# Patient Record
Sex: Female | Born: 1935 | Race: White | Hispanic: No | Smoking: Former smoker
Health system: Southern US, Community
[De-identification: ages and names within clinical notes are randomized; demographics above are authoritative.]

## PROBLEM LIST (undated history)

## (undated) DIAGNOSIS — I509 Heart failure, unspecified: Secondary | ICD-10-CM

## (undated) DIAGNOSIS — E119 Type 2 diabetes mellitus without complications: Secondary | ICD-10-CM

## (undated) DIAGNOSIS — R600 Localized edema: Secondary | ICD-10-CM

## (undated) DIAGNOSIS — I447 Left bundle-branch block, unspecified: Secondary | ICD-10-CM

## (undated) DIAGNOSIS — E559 Vitamin D deficiency, unspecified: Secondary | ICD-10-CM

## (undated) DIAGNOSIS — I2510 Atherosclerotic heart disease of native coronary artery without angina pectoris: Secondary | ICD-10-CM

## (undated) DIAGNOSIS — K219 Gastro-esophageal reflux disease without esophagitis: Secondary | ICD-10-CM

## (undated) DIAGNOSIS — I4891 Unspecified atrial fibrillation: Secondary | ICD-10-CM

## (undated) DIAGNOSIS — I4430 Unspecified atrioventricular block: Secondary | ICD-10-CM

## (undated) DIAGNOSIS — J449 Chronic obstructive pulmonary disease, unspecified: Secondary | ICD-10-CM

## (undated) DIAGNOSIS — G459 Transient cerebral ischemic attack, unspecified: Secondary | ICD-10-CM

## (undated) DIAGNOSIS — I10 Essential (primary) hypertension: Secondary | ICD-10-CM

## (undated) HISTORY — PX: OTHER SURGICAL HISTORY: SHX169

## (undated) HISTORY — PX: REPLACEMENT TOTAL KNEE BILATERAL: SUR1225

---

## 2020-09-29 ENCOUNTER — Encounter (HOSPITAL_COMMUNITY): Payer: Self-pay

## 2020-09-29 ENCOUNTER — Emergency Department (HOSPITAL_COMMUNITY): Payer: Medicare Other

## 2020-09-29 ENCOUNTER — Inpatient Hospital Stay (HOSPITAL_COMMUNITY)
Admission: EM | Admit: 2020-09-29 | Discharge: 2020-10-04 | DRG: 871 | Disposition: A | Discharge status: Home Health | Payer: Medicare Other | Attending: Internal Medicine | Admitting: Internal Medicine

## 2020-09-29 DIAGNOSIS — I248 Other forms of acute ischemic heart disease: Secondary | ICD-10-CM | POA: Diagnosis not present

## 2020-09-29 DIAGNOSIS — K219 Gastro-esophageal reflux disease without esophagitis: Secondary | ICD-10-CM | POA: Diagnosis present

## 2020-09-29 DIAGNOSIS — J690 Pneumonitis due to inhalation of food and vomit: Secondary | ICD-10-CM | POA: Diagnosis present

## 2020-09-29 DIAGNOSIS — E559 Vitamin D deficiency, unspecified: Secondary | ICD-10-CM | POA: Diagnosis present

## 2020-09-29 DIAGNOSIS — J9602 Acute respiratory failure with hypercapnia: Secondary | ICD-10-CM | POA: Diagnosis present

## 2020-09-29 DIAGNOSIS — Z888 Allergy status to other drugs, medicaments and biological substances status: Secondary | ICD-10-CM

## 2020-09-29 DIAGNOSIS — N179 Acute kidney failure, unspecified: Secondary | ICD-10-CM | POA: Diagnosis not present

## 2020-09-29 DIAGNOSIS — Z8673 Personal history of transient ischemic attack (TIA), and cerebral infarction without residual deficits: Secondary | ICD-10-CM

## 2020-09-29 DIAGNOSIS — Z20822 Contact with and (suspected) exposure to covid-19: Secondary | ICD-10-CM | POA: Diagnosis present

## 2020-09-29 DIAGNOSIS — Z881 Allergy status to other antibiotic agents status: Secondary | ICD-10-CM

## 2020-09-29 DIAGNOSIS — N390 Urinary tract infection, site not specified: Secondary | ICD-10-CM | POA: Diagnosis present

## 2020-09-29 DIAGNOSIS — N3001 Acute cystitis with hematuria: Secondary | ICD-10-CM

## 2020-09-29 DIAGNOSIS — R319 Hematuria, unspecified: Secondary | ICD-10-CM | POA: Diagnosis present

## 2020-09-29 DIAGNOSIS — R509 Fever, unspecified: Secondary | ICD-10-CM | POA: Diagnosis present

## 2020-09-29 DIAGNOSIS — Z7984 Long term (current) use of oral hypoglycemic drugs: Secondary | ICD-10-CM

## 2020-09-29 DIAGNOSIS — I2510 Atherosclerotic heart disease of native coronary artery without angina pectoris: Secondary | ICD-10-CM | POA: Diagnosis present

## 2020-09-29 DIAGNOSIS — I447 Left bundle-branch block, unspecified: Secondary | ICD-10-CM | POA: Diagnosis present

## 2020-09-29 DIAGNOSIS — Z7901 Long term (current) use of anticoagulants: Secondary | ICD-10-CM

## 2020-09-29 DIAGNOSIS — A419 Sepsis, unspecified organism: Secondary | ICD-10-CM | POA: Diagnosis present

## 2020-09-29 DIAGNOSIS — Z6825 Body mass index (BMI) 25.0-25.9, adult: Secondary | ICD-10-CM

## 2020-09-29 DIAGNOSIS — A4151 Sepsis due to Escherichia coli [E. coli]: Principal | ICD-10-CM | POA: Diagnosis present

## 2020-09-29 DIAGNOSIS — R0902 Hypoxemia: Secondary | ICD-10-CM

## 2020-09-29 DIAGNOSIS — Z882 Allergy status to sulfonamides status: Secondary | ICD-10-CM

## 2020-09-29 DIAGNOSIS — E7800 Pure hypercholesterolemia, unspecified: Secondary | ICD-10-CM | POA: Diagnosis present

## 2020-09-29 DIAGNOSIS — I959 Hypotension, unspecified: Secondary | ICD-10-CM | POA: Diagnosis present

## 2020-09-29 DIAGNOSIS — Z96653 Presence of artificial knee joint, bilateral: Secondary | ICD-10-CM | POA: Diagnosis present

## 2020-09-29 DIAGNOSIS — E119 Type 2 diabetes mellitus without complications: Secondary | ICD-10-CM | POA: Diagnosis present

## 2020-09-29 DIAGNOSIS — I272 Pulmonary hypertension, unspecified: Secondary | ICD-10-CM | POA: Diagnosis present

## 2020-09-29 DIAGNOSIS — Z87891 Personal history of nicotine dependence: Secondary | ICD-10-CM

## 2020-09-29 DIAGNOSIS — R531 Weakness: Secondary | ICD-10-CM

## 2020-09-29 DIAGNOSIS — Z886 Allergy status to analgesic agent status: Secondary | ICD-10-CM

## 2020-09-29 DIAGNOSIS — Z79899 Other long term (current) drug therapy: Secondary | ICD-10-CM

## 2020-09-29 DIAGNOSIS — R6520 Severe sepsis without septic shock: Secondary | ICD-10-CM | POA: Diagnosis present

## 2020-09-29 DIAGNOSIS — I480 Paroxysmal atrial fibrillation: Secondary | ICD-10-CM | POA: Diagnosis not present

## 2020-09-29 DIAGNOSIS — J440 Chronic obstructive pulmonary disease with acute lower respiratory infection: Secondary | ICD-10-CM | POA: Diagnosis present

## 2020-09-29 DIAGNOSIS — J9601 Acute respiratory failure with hypoxia: Secondary | ICD-10-CM | POA: Diagnosis present

## 2020-09-29 DIAGNOSIS — R6510 Systemic inflammatory response syndrome (SIRS) of non-infectious origin without acute organ dysfunction: Secondary | ICD-10-CM | POA: Diagnosis not present

## 2020-09-29 DIAGNOSIS — Z885 Allergy status to narcotic agent status: Secondary | ICD-10-CM

## 2020-09-29 DIAGNOSIS — E669 Obesity, unspecified: Secondary | ICD-10-CM | POA: Diagnosis present

## 2020-09-29 DIAGNOSIS — R0602 Shortness of breath: Secondary | ICD-10-CM

## 2020-09-29 DIAGNOSIS — J154 Pneumonia due to other streptococci: Secondary | ICD-10-CM | POA: Diagnosis present

## 2020-09-29 DIAGNOSIS — E785 Hyperlipidemia, unspecified: Secondary | ICD-10-CM | POA: Diagnosis present

## 2020-09-29 DIAGNOSIS — I10 Essential (primary) hypertension: Secondary | ICD-10-CM | POA: Diagnosis present

## 2020-09-29 DIAGNOSIS — G9340 Encephalopathy, unspecified: Secondary | ICD-10-CM | POA: Diagnosis present

## 2020-09-29 DIAGNOSIS — Z953 Presence of xenogenic heart valve: Secondary | ICD-10-CM

## 2020-09-29 DIAGNOSIS — Z794 Long term (current) use of insulin: Secondary | ICD-10-CM

## 2020-09-29 HISTORY — DX: Atherosclerotic heart disease of native coronary artery without angina pectoris: I25.10

## 2020-09-29 HISTORY — DX: Vitamin D deficiency, unspecified: E55.9

## 2020-09-29 HISTORY — DX: Unspecified atrial fibrillation: I48.91

## 2020-09-29 HISTORY — DX: Gastro-esophageal reflux disease without esophagitis: K21.9

## 2020-09-29 HISTORY — DX: Localized edema: R60.0

## 2020-09-29 HISTORY — DX: Left bundle-branch block, unspecified: I44.7

## 2020-09-29 HISTORY — DX: Essential (primary) hypertension: I10

## 2020-09-29 HISTORY — DX: Chronic obstructive pulmonary disease, unspecified: J44.9

## 2020-09-29 HISTORY — DX: Heart failure, unspecified: I50.9

## 2020-09-29 HISTORY — DX: Unspecified atrioventricular block: I44.30

## 2020-09-29 HISTORY — DX: Type 2 diabetes mellitus without complications: E11.9

## 2020-09-29 HISTORY — DX: Transient cerebral ischemic attack, unspecified: G45.9

## 2020-09-29 LAB — COMPREHENSIVE METABOLIC PANEL
ALT: 15 U/L (ref 0–44)
AST: 25 U/L (ref 15–41)
Albumin: 2.9 g/dL — ABNORMAL LOW (ref 3.5–5.0)
Alkaline Phosphatase: 99 U/L (ref 38–126)
Anion gap: 13 (ref 5–15)
BUN: 25 mg/dL — ABNORMAL HIGH (ref 8–23)
CO2: 23 mmol/L (ref 22–32)
Calcium: 8.3 mg/dL — ABNORMAL LOW (ref 8.9–10.3)
Chloride: 96 mmol/L — ABNORMAL LOW (ref 98–111)
Creatinine, Ser: 1.24 mg/dL — ABNORMAL HIGH (ref 0.44–1.00)
GFR, Estimated: 43 mL/min — ABNORMAL LOW (ref >60)
Glucose, Bld: 214 mg/dL — ABNORMAL HIGH (ref 70–99)
Potassium: 5 mmol/L (ref 3.5–5.1)
Sodium: 132 mmol/L — ABNORMAL LOW (ref 135–145)
Total Bilirubin: 3.4 mg/dL — ABNORMAL HIGH (ref 0.3–1.2)
Total Protein: 5.9 g/dL — ABNORMAL LOW (ref 6.5–8.1)

## 2020-09-29 LAB — CBC WITH DIFFERENTIAL/PLATELET
Abs Immature Granulocytes: 0.14 10*3/uL — ABNORMAL HIGH (ref 0.00–0.07)
Basophils Absolute: 0 10*3/uL (ref 0.0–0.1)
Basophils Relative: 0 %
Eosinophils Absolute: 0 10*3/uL (ref 0.0–0.5)
Eosinophils Relative: 0 %
HCT: 42.9 % (ref 36.0–46.0)
Hemoglobin: 13.9 g/dL (ref 12.0–15.0)
Immature Granulocytes: 1 %
Lymphocytes Relative: 4 %
Lymphs Abs: 0.7 10*3/uL (ref 0.7–4.0)
MCH: 31 pg (ref 26.0–34.0)
MCHC: 32.4 g/dL (ref 30.0–36.0)
MCV: 95.8 fL (ref 80.0–100.0)
Monocytes Absolute: 0.7 10*3/uL (ref 0.1–1.0)
Monocytes Relative: 4 %
Neutro Abs: 15.7 10*3/uL — ABNORMAL HIGH (ref 1.7–7.7)
Neutrophils Relative %: 91 %
Platelets: 200 10*3/uL (ref 150–400)
RBC: 4.48 MIL/uL (ref 3.87–5.11)
RDW: 13.5 % (ref 11.5–15.5)
WBC: 17.3 10*3/uL — ABNORMAL HIGH (ref 4.0–10.5)
nRBC: 0 % (ref 0.0–0.2)

## 2020-09-29 LAB — URINALYSIS, ROUTINE W REFLEX MICROSCOPIC
Bilirubin Urine: NEGATIVE
Glucose, UA: NEGATIVE mg/dL
Ketones, ur: 5 mg/dL — AB
Nitrite: NEGATIVE
Protein, ur: 100 mg/dL — AB
Specific Gravity, Urine: 1.014 (ref 1.005–1.030)
WBC, UA: >50 WBC/hpf — ABNORMAL HIGH (ref 0–5)
pH: 5 (ref 5.0–8.0)

## 2020-09-29 LAB — RESP PANEL BY RT-PCR (FLU A&B, COVID) ARPGX2
Influenza A by PCR: NEGATIVE
Influenza B by PCR: NEGATIVE
SARS Coronavirus 2 by RT PCR: NEGATIVE

## 2020-09-29 LAB — TROPONIN I (HIGH SENSITIVITY)
Troponin I (High Sensitivity): 14 ng/L (ref <18)
Troponin I (High Sensitivity): 19 ng/L — ABNORMAL HIGH (ref <18)

## 2020-09-29 LAB — LIPASE, BLOOD: Lipase: 26 U/L (ref 11–51)

## 2020-09-29 LAB — CBG MONITORING, ED: Glucose-Capillary: 204 mg/dL — ABNORMAL HIGH (ref 70–99)

## 2020-09-29 LAB — LACTIC ACID, PLASMA: Lactic Acid, Venous: 2 mmol/L (ref 0.5–1.9)

## 2020-09-29 MED ORDER — SODIUM CHLORIDE 0.9 % IV BOLUS
500.0000 mL | Freq: Once | INTRAVENOUS | Status: AC
  Administered 2020-09-29: 500 mL via INTRAVENOUS

## 2020-09-29 MED ORDER — DILTIAZEM HCL ER COATED BEADS 120 MG PO CP24
120.0000 mg | ORAL_CAPSULE | Freq: Every day | ORAL | Status: DC
  Filled 2020-09-29: qty 1

## 2020-09-29 MED ORDER — ACETAMINOPHEN 650 MG RE SUPP: 650.0000 mg | Freq: Four times a day (QID) | RECTAL | Status: DC | PRN

## 2020-09-29 MED ORDER — SODIUM CHLORIDE 0.9 % IV SOLN: INTRAVENOUS | Status: DC

## 2020-09-29 MED ORDER — PANTOPRAZOLE SODIUM 40 MG PO TBEC
40.0000 mg | DELAYED_RELEASE_TABLET | Freq: Every day | ORAL | Status: DC
  Administered 2020-09-30: 40 mg via ORAL
  Filled 2020-09-29: qty 1

## 2020-09-29 MED ORDER — INSULIN GLARGINE 100 UNIT/ML ~~LOC~~ SOLN
72.0000 [IU] | Freq: Every day | SUBCUTANEOUS | Status: DC
  Filled 2020-09-29: qty 0.72

## 2020-09-29 MED ORDER — ACETAMINOPHEN 500 MG PO TABS
1000.0000 mg | ORAL_TABLET | Freq: Once | ORAL | Status: AC
  Administered 2020-09-29: 1000 mg via ORAL
  Filled 2020-09-29: qty 2

## 2020-09-29 MED ORDER — SODIUM CHLORIDE 0.9 % IV SOLN
500.0000 mg | INTRAVENOUS | Status: DC
  Administered 2020-09-29 – 2020-09-30 (×2): 500 mg via INTRAVENOUS
  Filled 2020-09-29 (×3): qty 500

## 2020-09-29 MED ORDER — SODIUM CHLORIDE 0.9 % IV SOLN
2.0000 g | INTRAVENOUS | Status: DC
  Administered 2020-09-29 – 2020-10-01 (×3): 2 g via INTRAVENOUS
  Filled 2020-09-29 (×4): qty 20

## 2020-09-29 MED ORDER — MELATONIN 5 MG PO TABS
5.0000 mg | ORAL_TABLET | Freq: Every day | ORAL | Status: DC
  Administered 2020-09-30: 5 mg via ORAL
  Filled 2020-09-29: qty 1

## 2020-09-29 MED ORDER — EZETIMIBE 10 MG PO TABS
10.0000 mg | ORAL_TABLET | Freq: Every day | ORAL | Status: DC
  Administered 2020-09-30 – 2020-10-04 (×5): 10 mg via ORAL
  Filled 2020-09-29 (×5): qty 1

## 2020-09-29 MED ORDER — METOPROLOL SUCCINATE ER 100 MG PO TB24: 100.0000 mg | ORAL_TABLET | Freq: Every day | ORAL | Status: DC

## 2020-09-29 MED ORDER — GABAPENTIN 300 MG PO CAPS: 300.0000 mg | ORAL_CAPSULE | Freq: Two times a day (BID) | ORAL | Status: DC

## 2020-09-29 MED ORDER — INSULIN ASPART 100 UNIT/ML ~~LOC~~ SOLN: 0.0000 [IU] | Freq: Three times a day (TID) | SUBCUTANEOUS | Status: DC

## 2020-09-29 MED ORDER — APIXABAN 5 MG PO TABS
5.0000 mg | ORAL_TABLET | Freq: Two times a day (BID) | ORAL | Status: DC
  Administered 2020-09-30 – 2020-10-01 (×4): 5 mg via ORAL
  Filled 2020-09-29 (×4): qty 1

## 2020-09-29 MED ORDER — ACETAMINOPHEN 325 MG PO TABS: 650.0000 mg | ORAL_TABLET | Freq: Four times a day (QID) | ORAL | Status: DC | PRN

## 2020-09-29 NOTE — ED Notes (Signed)
Dr. Jacqulyn Bath sent secure chart regarding pt's HR - 119, O2 sat 88% on RA, BP - 143/128.

## 2020-09-29 NOTE — H&P (Addendum)
History and Physical    Brianna Lowe OIN:[id number] DOB: [birth year] DOA: 09/29/2020  PCP: Pcp, No  Patient coming from: Independent living facility.  Chief Complaint: Weakness nausea vomiting.  HPI: Brianna Lowe is a 86 y.o. female with history of TAVR in December 2021 paroxysmal atrial fibrillation, diabetes mellitus type 2, moderate mitral stenosis history of CAD and recent left femur fracture status post surgery who lives in independent living facility was found to be increasingly weak over the last 24 hours with nausea vomiting and episode of diarrhea.  Was brought to the ER.  Denies any chest pain or shortness of breath.  Denies any change in her medications.  Over the last 2 months patient has been noticing a rash in the upper chest and upper back.  Was recently placed on antibiotics for which.  ED Course: In the ER patient was febrile with temperature 103 F tachycardic Covid test was negative.  Patient mildly hypoxic with requiring 2 L oxygen chest x-ray was showing chronic left pleural effusion with possible mild edema.  Lactic acid was 2 WBC count of 17 creatinine 1.2 concerning features for possible delving sepsis source not clear.  UA still pending.  Patient admitted for further management.  Review of Systems: As per HPI, rest all negative.   Past Medical History:  Diagnosis Date  . Atrial fibrillation (HCC)   . AV block   . CAD (coronary artery disease)   . Congestive heart failure (CHF) (HCC)   . COPD (chronic obstructive pulmonary disease) (HCC)   . Diabetes (HCC)   . GERD (gastroesophageal reflux disease)   . Hypertension   . Left bundle branch block   . Localized edema   . TIA (transient ischemic attack)   . Vitamin D deficiency     Past Surgical History:  Procedure Laterality Date  . heart valve    . REPLACEMENT TOTAL KNEE BILATERAL       reports that she has quit smoking. She has never used smokeless tobacco. She reports that she does not drink  alcohol and does not use drugs.  Allergies  Allergen Reactions  . Doxycycline Anaphylaxis  . Aspirin Other (See Comments)    Advised not to use due to family hx of macular degeneration Advised not to use due to family hx of macular degeneration   . Codeine   . Metoprolol     Unknown reaction  . Tramadol   . Ace Inhibitors Rash  . Hydrocodone Nausea And Vomiting  . Hydrocodone-Acetaminophen Nausea And Vomiting  . Oxycodone-Acetaminophen Nausea And Vomiting  . Propoxyphene Nausea And Vomiting  . Statins Rash  . Sulfa Antibiotics Rash    Family History  Problem Relation Age of Onset  . Diabetes Mellitus II Neg Hx     Prior to Admission medications   Medication Sig Start Date End Date Taking? Authorizing Provider  amoxicillin (AMOXIL) 500 MG capsule Take 2,000 mg by mouth as needed (prior to dental appointment). 06/04/20  Yes [provider]  cephALEXin (KEFLEX) 500 MG capsule Take 500 mg by mouth See admin instructions. Bid x 5 days Patient not taking: Reported on 09/29/2020 08/19/20   [provider]  diltiazem (CARDIZEM CD) 120 MG 24 hr capsule Take 120 mg by mouth daily. 07/07/20   [provider]  ELIQUIS 5 MG TABS tablet Take 5 mg by mouth 2 (two) times daily. 07/15/20   [provider]  ezetimibe (ZETIA) 10 MG tablet Take 10 mg by mouth daily. 07/15/20  [provider]  furosemide (LASIX) 40 MG tablet Take 40 mg by mouth daily. 04/08/20   [provider]  gabapentin (NEURONTIN) 300 MG capsule Take 300 mg by mouth in the morning and at bedtime. 07/10/20   [provider]  glipiZIDE (GLUCOTROL XL) 5 MG 24 hr tablet Take 5 mg by mouth daily. 07/10/20   [provider]  insulin glargine, 2 Unit Dial, (TOUJEO MAX SOLOSTAR) 300 UNIT/ML Solostar Pen Inject 72 Units into the skin at bedtime. 04/12/20   [provider]  Melatonin 5 MG CAPS Take 5 mg by mouth at bedtime. 01/13/20   [provider]   metoprolol succinate (TOPROL-XL) 100 MG 24 hr tablet Take 100 mg by mouth daily. 07/07/20   [provider]  OZEMPIC, 0.25 OR 0.5 MG/DOSE, 2 MG/1.5ML SOPN Inject into the skin. 09/26/20   [provider]  pantoprazole (PROTONIX) 40 MG tablet Take 40 mg by mouth daily. 07/15/20   [provider]  spironolactone (ALDACTONE) 25 MG tablet Take 25 mg by mouth in the morning and at bedtime. 04/08/20   [provider]    Physical Exam: Constitutional: Moderately built and nourished. Vitals:   09/29/20 2130 09/29/20 2145 09/29/20 2200 09/29/20 2208  BP: (!) 143/51 (!) 120/51 (!) 125/49   Pulse: (!) 115 (!) 114 (!) 112 (!) 112  Resp: (!) 34 (!) 33 (!) 29   Temp:    (!) 102.7 F (39.3 C)  TempSrc:    Rectal  SpO2: 98% 97% 96% 96%  Weight:      Height:       Eyes: Anicteric no pallor. ENMT: No discharge from the ears eyes nose mouth. Neck: No mass felt.  No neck rigidity. Respiratory: No rhonchi or crepitations. Cardiovascular: S1-S2 heard. Abdomen: Soft nontender bowel sounds present. Musculoskeletal: No edema. Skin: Rash on the chest and upper back.  Maculopapular. Neurologic: Alert awake oriented to time place and person.  Moves all extremities. Psychiatric: Appears normal.  Normal affect.   Labs on Admission: I have personally reviewed following labs and imaging studies  CBC: Recent Labs  Lab 09/29/20 1820  WBC 17.3*  NEUTROABS 15.7*  HGB 13.9  HCT 42.9  MCV 95.8  PLT 200   Basic Metabolic Panel: Recent Labs  Lab 09/29/20 1820  NA 132*  K 5.0  CL 96*  CO2 23  GLUCOSE 214*  BUN 25*  CREATININE 1.24*  CALCIUM 8.3*   GFR: Estimated Creatinine Clearance: 32.6 mL/min (A) (by C-G formula based on SCr of 1.24 mg/dL (H)). Liver Function Tests: Recent Labs  Lab 09/29/20 1820  AST 25  ALT 15  ALKPHOS 99  BILITOT 3.4*  PROT 5.9*  ALBUMIN 2.9*   Recent Labs  Lab 09/29/20 1820  LIPASE 26   No results for input(s): AMMONIA in  the last 168 hours. Coagulation Profile: No results for input(s): INR, PROTIME in the last 168 hours. Cardiac Enzymes: No results for input(s): CKTOTAL, CKMB, CKMBINDEX, TROPONINI in the last 168 hours. BNP (last 3 results) No results for input(s): PROBNP in the last 8760 hours. HbA1C: No results for input(s): HGBA1C in the last 72 hours. CBG: Recent Labs  Lab 09/29/20 1808  GLUCAP 204*   Lipid Profile: No results for input(s): CHOL, HDL, LDLCALC, TRIG, CHOLHDL, LDLDIRECT in the last 72 hours. Thyroid Function Tests: No results for input(s): TSH, T4TOTAL, FREET4, T3FREE, THYROIDAB in the last 72 hours. Anemia Panel: No results for input(s): VITAMINB12, FOLATE, FERRITIN, TIBC, IRON, RETICCTPCT  in the last 72 hours. Urine analysis:    Component Value Date/Time   COLORURINE AMBER (A) 09/29/2020 2155   APPEARANCEUR CLOUDY (A) 09/29/2020 2155   LABSPEC 1.014 09/29/2020 2155   PHURINE 5.0 09/29/2020 2155   GLUCOSEU NEGATIVE 09/29/2020 2155   HGBUR MODERATE (A) 09/29/2020 2155   BILIRUBINUR NEGATIVE 09/29/2020 2155   KETONESUR 5 (A) 09/29/2020 2155   PROTEINUR 100 (A) 09/29/2020 2155   NITRITE NEGATIVE 09/29/2020 2155   LEUKOCYTESUR LARGE (A) 09/29/2020 2155   Sepsis Labs: @LABRCNTIP (procalcitonin:4,lacticidven:4) ) Recent Results (from the past 240 hour(s))  Resp Panel by RT-PCR (Flu A&B, Covid) Nasopharyngeal Swab     Status: None   Collection Time: 09/29/20  7:22 PM   Specimen: Nasopharyngeal Swab; Nasopharyngeal(NP) swabs in vial transport medium  Result Value Ref Range Status   SARS Coronavirus 2 by RT PCR NEGATIVE NEGATIVE Final    Comment: (NOTE) SARS-CoV-2 target nucleic acids are NOT DETECTED.  The SARS-CoV-2 RNA is generally detectable in upper respiratory specimens during the acute phase of infection. The lowest concentration of SARS-CoV-2 viral copies this assay can detect is 138 copies/mL. A negative result does not preclude SARS-Cov-2 infection and should  not be used as the sole basis for treatment or other patient management decisions. A negative result may occur with  improper specimen collection/handling, submission of specimen other than nasopharyngeal swab, presence of viral mutation(s) within the areas targeted by this assay, and inadequate number of viral copies(<138 copies/mL). A negative result must be combined with clinical observations, patient history, and epidemiological information. The expected result is Negative.  Fact Sheet for Patients:  10/01/20  Fact Sheet for Healthcare Providers:  BloggerCourse.com  This test is no t yet approved or cleared by the [employer] FDA and  has been authorized for detection and/or diagnosis of SARS-CoV-2 by FDA under an Emergency Use Authorization (EUA). This EUA will remain  in effect (meaning this test can be used) for the duration of the COVID-19 declaration under Section 564(b)(1) of the Act, 21 U.S.C.section 360bbb-3(b)(1), unless the authorization is terminated  or revoked sooner.       Influenza A by PCR NEGATIVE NEGATIVE Final   Influenza B by PCR NEGATIVE NEGATIVE Final    Comment: (NOTE) The Xpert Xpress SARS-CoV-2/FLU/RSV plus assay is intended as an aid in the diagnosis of influenza from Nasopharyngeal swab specimens and should not be used as a sole basis for treatment. Nasal washings and aspirates are unacceptable for Xpert Xpress SARS-CoV-2/FLU/RSV testing.  Fact Sheet for Patients: [place]  Fact Sheet for Healthcare Providers: BloggerCourse.com  This test is not yet approved or cleared by the [employer] FDA and has been authorized for detection and/or diagnosis of SARS-CoV-2 by FDA under an Emergency Use Authorization (EUA). This EUA will remain in effect (meaning this test can be used) for the duration of the COVID-19 declaration under  Section 564(b)(1) of the Act, 21 U.S.C. section 360bbb-3(b)(1), unless the authorization is terminated or revoked.  Performed at [facility], [address] [place], [place]      Radiological Exams on Admission: DG Chest Portable 1 View  Result Date: 09/29/2020 CLINICAL DATA:  Weakness, hypoxemia, nausea, vomiting and diarrhea the EXAM: PORTABLE CHEST 1 VIEW COMPARISON:  CT and radiograph 04/01/2020 FINDINGS: Chronic elevation left hemidiaphragm. Some hazy opacities present in the lung bases with a mid to lower lung gradient. Slightly congested pulmonary vascularity. No pneumothorax. Left basilar pleural thickening is similar to prior likely reflects chronic  effusion. Cardiomegaly with transcatheter aortic valve replacement in stable position. The aorta is calcified. The remaining cardiomediastinal contours are unremarkable. Telemetry leads overlie the chest. The osseous structures appear diffusely demineralized which may limit detection of small or nondisplaced fractures. Degenerative changes are present in the imaged spine and shoulders. No other acute osseous or soft tissue abnormality. IMPRESSION: 1. Chronic elevation left hemidiaphragm 2. Left basilar pleural thickening, likely chronic effusion. 3. Pulmonary vascular congestion with basilar opacity likely reflecting atelectasis and/or mild edema. 4. Stable cardiomegaly.  Prior TAVR in stable position. Electronically Signed   By: Kreg ShropshirePrice  DeHay M.D.   On: 09/29/2020 20:03    EKG: Independently reviewed.  Atrial flutter rate of 120 bpm.  Assessment/Plan Principal Problem:   SIRS (systemic inflammatory response syndrome) (HCC) Active Problems:   Acute febrile illness   ARF (acute renal failure) (HCC)   PAF (paroxysmal atrial fibrillation) (HCC)    1. SIRS with possible developing sepsis source not clear UA still pending.  Blood cultures been obtained.  Patient has been empirically started on antibiotics.  Follow cultures  fluids follow lactic acid levels closely monitor.  Since patient had nausea vomiting intra-abdominal could be the likely source for which I will also order a CT abdomen pelvis along with UA. 2. Rash of the upper chest and upper back cause not clear.  Has been ongoing for the last 2 months. 3. History of proximal atrial fibrillation on metoprolol apixaban and Cardizem.  Rate is mildly elevated likely from developing sepsis. 4. Diabetes mellitus type 2 on Lantus insulin 72 units at bedtime.  We will keep patient on sliding scale coverage. 5. Acute renal failure likely from nausea vomiting and diarrhea.  Gently hydrate follow metabolic panel.  Creatinine increased from baseline when compared to in Care Everywhere. 6. History of TAVR and December 2021. 7. History of moderate mitral stenosis.  2D echo done in February 2022. 8. History of CAD denies any chest pain.  Given the septic picture on presentation I am holding off patient's Lasix and spironolactone for now.  Addendum -patient became acutely short of breath and encephalopathic.  Repeat chest x-ray was ordered along with BNP lactic acid troponin ABG and patient was placed on BiPAP.  Discussed with patient's brother Kennith CenterHines and also with patient and confirmed full code.  Will consult pulmonary critical care.  Lasix 40 mg IV was ordered along with Solu-Medrol.  DVT prophylaxis: Lovenox. Code Status: Full code. Family Communication: Discussed with patient. Disposition Plan: Back to facility when stable. Consults called: None. Admission status: Observation.   Eduard ClosArshad N Quintina Hakeem MD [facility] Pager [phone].  If 7PM-7AM, please contact night-coverage [email or web address] Password [email or web address]  09/29/2020, 11:34 PM

## 2020-09-29 NOTE — ED Triage Notes (Signed)
Pt arrives via EMS from [facility] independent living with complaints of weakness, N/V X1 and diarrhea X1 episode. Pt not eating nor drinking today.  EMS noted ataxia and shuffling steps. Pt alert and oriented X4.   CBG 284 BP 139/85 HR 93 93-95% RA Temp 98.2  20g LAC 500 NS given by EMS

## 2020-09-29 NOTE — ED Notes (Signed)
Dr. Jacqulyn Bath notified of temp of 103 rectal.

## 2020-09-29 NOTE — ED Provider Notes (Signed)
Emergency Department Provider Note   I have reviewed the triage vital signs and the nursing notes.   HISTORY  Chief Complaint Weakness   HPI Brianna Lowe is a 86 y.o. female with past medical history of diabetes, COPD, CHF, hypertension, hypercholesterolemia, and prior stroke presents to the emergency department with generalized weakness, tremor in the bilateral upper extremities, and one episode of vomiting today.  Patient states that she just been feeling more fatigued than normal overall.  She did eat breakfast but has had less of an appetite as the days gone on.  She does not have abdominal pain, chest pain, shortness of breath, URI symptoms.  She lives in independent living and has been up and ambulatory today although somewhat less than normal.  She denies noticing any fevers or chills.  Denies any dysuria, hesitancy, urgency.  Denies any new medications or dose changes.   Past Medical History:  Diagnosis Date  . Atrial fibrillation (HCC)   . AV block   . CAD (coronary artery disease)   . Congestive heart failure (CHF) (HCC)   . COPD (chronic obstructive pulmonary disease) (HCC)   . Diabetes (HCC)   . GERD (gastroesophageal reflux disease)   . Hypertension   . Left bundle branch block   . Localized edema   . TIA (transient ischemic attack)   . Vitamin D deficiency     Patient Active Problem List   Diagnosis Date Noted  . Acute febrile illness 09/29/2020   Allergies Doxycycline, Aspirin, Codeine, Metoprolol, Tramadol, Ace inhibitors, Hydrocodone, Hydrocodone-acetaminophen, Oxycodone-acetaminophen, Propoxyphene, Statins, and Sulfa antibiotics  No family history on file.  Social History    Review of Systems  Constitutional: No fever/chills. Positive generalized weakness.  Eyes: No visual changes. ENT: No sore throat. Cardiovascular: Denies chest pain. Respiratory: Denies shortness of breath. Gastrointestinal: No abdominal pain.  Positive nausea and  vomiting.  No diarrhea.  No constipation. Genitourinary: Negative for dysuria. Musculoskeletal: Negative for back pain. Skin: Negative for rash. Neurological: Negative for headaches, focal weakness or numbness.  10-point ROS otherwise negative.  ____________________________________________   PHYSICAL EXAM:  VITAL SIGNS: ED Triage Vitals  Enc Vitals Group     BP 09/29/20 1759 124/60     Pulse Rate 09/29/20 1759 98     Resp 09/29/20 1759 16     Temp 09/29/20 1759 99.8 F (37.7 C)     Temp Source 09/29/20 1759 Oral     SpO2 09/29/20 1759 94 %     Weight 09/29/20 1813 150 lb (68 kg)     Height 09/29/20 1813 5\' 4"  (1.626 m)   Constitutional: Alert and oriented. Well appearing and in no acute distress. Eyes: Conjunctivae are normal.  Head: Atraumatic. Nose: No congestion/rhinnorhea. Mouth/Throat: Mucous membranes are slightly dry.  Neck: No stridor.   Cardiovascular: Normal rate, regular rhythm. Good peripheral circulation. Grossly normal heart sounds.   Respiratory: Normal respiratory effort.  No retractions. Lungs CTAB. Gastrointestinal: Soft and nontender. No distention.  Musculoskeletal: No lower extremity tenderness nor edema. No gross deformities of extremities. Neurologic:  Normal speech and language.  Patient with 5/5 strength in the bilateral upper and lower extremities with normal sensation.  Patient does have a mild intention tremor which seems equal in the bilateral upper extremities.  Skin:  Skin is warm, dry and intact. No rash noted.  ____________________________________________   LABS (all labs ordered are listed, but only abnormal results are displayed)  Labs Reviewed  COMPREHENSIVE METABOLIC PANEL - Abnormal; Notable for the  following components:      Result Value   Sodium 132 (*)    Chloride 96 (*)    Glucose, Bld 214 (*)    BUN 25 (*)    Creatinine, Ser 1.24 (*)    Calcium 8.3 (*)    Total Protein 5.9 (*)    Albumin 2.9 (*)    Total Bilirubin 3.4  (*)    GFR, Estimated 43 (*)    All other components within normal limits  CBC WITH DIFFERENTIAL/PLATELET - Abnormal; Notable for the following components:   WBC 17.3 (*)    Neutro Abs 15.7 (*)    Abs Immature Granulocytes 0.14 (*)    All other components within normal limits  URINALYSIS, ROUTINE W REFLEX MICROSCOPIC - Abnormal; Notable for the following components:   Color, Urine AMBER (*)    APPearance CLOUDY (*)    Hgb urine dipstick MODERATE (*)    Ketones, ur 5 (*)    Protein, ur 100 (*)    Leukocytes,Ua LARGE (*)    WBC, UA >50 (*)    Bacteria, UA MANY (*)    All other components within normal limits  LACTIC ACID, PLASMA - Abnormal; Notable for the following components:   Lactic Acid, Venous 2.0 (*)    All other components within normal limits  CBG MONITORING, ED - Abnormal; Notable for the following components:   Glucose-Capillary 204 (*)    All other components within normal limits  TROPONIN I (HIGH SENSITIVITY) - Abnormal; Notable for the following components:   Troponin I (High Sensitivity) 19 (*)    All other components within normal limits  RESP PANEL BY RT-PCR (FLU A&B, COVID) ARPGX2  CULTURE, BLOOD (ROUTINE X 2)  CULTURE, BLOOD (ROUTINE X 2)  LIPASE, BLOOD  LACTIC ACID, PLASMA  TROPONIN I (HIGH SENSITIVITY)   ____________________________________________  EKG   EKG Interpretation  Date/Time:  Thursday September 29 2020 18:01:47 EDT Ventricular Rate:  96 PR Interval:  200 QRS Duration: 96 QT Interval:  364 QTC Calculation: 460 R Axis:   -40 Text Interpretation: Sinus rhythm Abnormal R-wave progression, early transition LVH with secondary repolarization abnormality no prior for comparison Confirmed by Alona Bene [phone]) on 09/29/2020 6:06:14 PM       ____________________________________________  RADIOLOGY  DG Chest Portable 1 View  Result Date: 09/29/2020 CLINICAL DATA:  Weakness, hypoxemia, nausea, vomiting and diarrhea the EXAM: PORTABLE CHEST 1  VIEW COMPARISON:  CT and radiograph 04/01/2020 FINDINGS: Chronic elevation left hemidiaphragm. Some hazy opacities present in the lung bases with a mid to lower lung gradient. Slightly congested pulmonary vascularity. No pneumothorax. Left basilar pleural thickening is similar to prior likely reflects chronic effusion. Cardiomegaly with transcatheter aortic valve replacement in stable position. The aorta is calcified. The remaining cardiomediastinal contours are unremarkable. Telemetry leads overlie the chest. The osseous structures appear diffusely demineralized which may limit detection of small or nondisplaced fractures. Degenerative changes are present in the imaged spine and shoulders. No other acute osseous or soft tissue abnormality. IMPRESSION: 1. Chronic elevation left hemidiaphragm 2. Left basilar pleural thickening, likely chronic effusion. 3. Pulmonary vascular congestion with basilar opacity likely reflecting atelectasis and/or mild edema. 4. Stable cardiomegaly.  Prior TAVR in stable position. Electronically Signed   By: Kreg Shropshire M.D.   On: 09/29/2020 20:03    ____________________________________________   PROCEDURES  Procedure(s) performed:   .Critical Care Performed by: Maia Plan, MD Authorized by: Maia Plan, MD   Critical care provider statement:  Critical care time (minutes):  45   Critical care time was exclusive of:  Separately billable procedures and treating other patients and teaching time   Critical care was necessary to treat or prevent imminent or life-threatening deterioration of the following conditions:  Sepsis   Critical care was time spent personally by me on the following activities:  Discussions with consultants, evaluation of patient's response to treatment, examination of patient, ordering and performing treatments and interventions, ordering and review of laboratory studies, ordering and review of radiographic studies, pulse oximetry,  re-evaluation of patient's condition, obtaining history from patient or surrogate, review of old charts, blood draw for specimens and development of treatment plan with patient or surrogate   I assumed direction of critical care for this patient from another provider in my specialty: no     Care discussed with: admitting provider       ____________________________________________   INITIAL IMPRESSION / ASSESSMENT AND PLAN / ED COURSE  Pertinent labs & imaging results that were available during my care of the patient were reviewed by me and considered in my medical decision making (see chart for details).   Patient presents to the emergency department for evaluation of generalized weakness with some tremor in the upper extremities along with one episode of vomiting today.  No new medications.  Other than tremor the patient has no focal neurologic deficits.  The tremor does appear bilateral in the upper extremities.  She does appear clinically somewhat dehydrated.  She has borderline elevated temp but no significant tachycardia, hypotension, other findings to suspect sepsis or other developing serious bacterial infection.  I not appreciate any focal neurologic deficits to suspect stroke.  She is not having any subjective abdominal pain in her abdominal exam is benign.  Plan for initial blood work, Covid testing, IV fluids and plan to reassess.  EKG reviewed by me as above with no prior tracings for comparison.   08:20 PM  I went back to evaluate the patient.  She seems much more confused at this time.  She denies any pain but is much less coherent than before.  She does not have focal neurologic deficits.  She has developed some tachycardia and hypoxemia here.  Will add lactate along with blood cultures.  Chest x-ray with no obvious pneumonia but difficult to exclude some atelectasis. Plan to start abx.  After fever reduction patient has returned to her initial mental status. She is having a  conversation with me and seems much more appropriate. Suspect temporary AMS was 2/2 fever and not encephalitis/meningitis which was also considered. UA shows UTI and lactate is slightly elevated at 2.0.   Discussed patient's case with TRH to request admission. Patient and family (if present) updated with plan. Care transferred to [facility] service.  I reviewed all nursing notes, vitals, pertinent old records, EKGs, labs, imaging (as available).  ____________________________________________  FINAL CLINICAL IMPRESSION(S) / ED DIAGNOSES  Final diagnoses:  Generalized weakness  Fever, unspecified fever cause  Hypoxemia  Acute cystitis with hematuria  Sepsis, due to unspecified organism, unspecified whether acute organ dysfunction present [facility])     MEDICATIONS GIVEN DURING THIS VISIT:  Medications  cefTRIAXone (ROCEPHIN) 2 g in sodium chloride 0.9 % 100 mL IVPB (0 g Intravenous Stopped 09/29/20 2139)  azithromycin (ZITHROMAX) 500 mg in sodium chloride 0.9 % 250 mL IVPB (500 mg Intravenous New Bag/Given 09/29/20 2206)  sodium chloride 0.9 % bolus 500 mL (0 mLs Intravenous Stopped 09/29/20 1917)  sodium chloride 0.9 % bolus  500 mL (0 mLs Intravenous Stopped 09/29/20 2155)  acetaminophen (TYLENOL) tablet 1,000 mg (1,000 mg Oral Given 09/29/20 2058)    Note:  This document was prepared using Dragon voice recognition software and may include unintentional dictation errors.  Alona BeneJoshua Ezell Poke, MD, [facility] Emergency Medicine    Nevaeh Casillas, Arlyss RepressJoshua G, MD 09/29/20 [phone]

## 2020-09-30 ENCOUNTER — Observation Stay (HOSPITAL_COMMUNITY): Payer: Medicare Other

## 2020-09-30 ENCOUNTER — Inpatient Hospital Stay (HOSPITAL_COMMUNITY): Payer: Medicare Other

## 2020-09-30 DIAGNOSIS — A419 Sepsis, unspecified organism: Secondary | ICD-10-CM | POA: Diagnosis present

## 2020-09-30 DIAGNOSIS — R778 Other specified abnormalities of plasma proteins: Secondary | ICD-10-CM

## 2020-09-30 DIAGNOSIS — I959 Hypotension, unspecified: Secondary | ICD-10-CM | POA: Diagnosis present

## 2020-09-30 DIAGNOSIS — E119 Type 2 diabetes mellitus without complications: Secondary | ICD-10-CM | POA: Diagnosis present

## 2020-09-30 DIAGNOSIS — I342 Nonrheumatic mitral (valve) stenosis: Secondary | ICD-10-CM | POA: Diagnosis not present

## 2020-09-30 DIAGNOSIS — R531 Weakness: Secondary | ICD-10-CM | POA: Diagnosis present

## 2020-09-30 DIAGNOSIS — J154 Pneumonia due to other streptococci: Secondary | ICD-10-CM | POA: Diagnosis present

## 2020-09-30 DIAGNOSIS — E7800 Pure hypercholesterolemia, unspecified: Secondary | ICD-10-CM | POA: Diagnosis present

## 2020-09-30 DIAGNOSIS — R0603 Acute respiratory distress: Secondary | ICD-10-CM

## 2020-09-30 DIAGNOSIS — I248 Other forms of acute ischemic heart disease: Secondary | ICD-10-CM | POA: Diagnosis not present

## 2020-09-30 DIAGNOSIS — N179 Acute kidney failure, unspecified: Secondary | ICD-10-CM | POA: Diagnosis present

## 2020-09-30 DIAGNOSIS — R6510 Systemic inflammatory response syndrome (SIRS) of non-infectious origin without acute organ dysfunction: Secondary | ICD-10-CM

## 2020-09-30 DIAGNOSIS — Z87891 Personal history of nicotine dependence: Secondary | ICD-10-CM | POA: Diagnosis not present

## 2020-09-30 DIAGNOSIS — A4151 Sepsis due to Escherichia coli [E. coli]: Secondary | ICD-10-CM | POA: Diagnosis present

## 2020-09-30 DIAGNOSIS — R509 Fever, unspecified: Secondary | ICD-10-CM | POA: Diagnosis not present

## 2020-09-30 DIAGNOSIS — K219 Gastro-esophageal reflux disease without esophagitis: Secondary | ICD-10-CM | POA: Diagnosis present

## 2020-09-30 DIAGNOSIS — I480 Paroxysmal atrial fibrillation: Secondary | ICD-10-CM | POA: Diagnosis present

## 2020-09-30 DIAGNOSIS — E559 Vitamin D deficiency, unspecified: Secondary | ICD-10-CM | POA: Diagnosis present

## 2020-09-30 DIAGNOSIS — I447 Left bundle-branch block, unspecified: Secondary | ICD-10-CM | POA: Diagnosis present

## 2020-09-30 DIAGNOSIS — I2510 Atherosclerotic heart disease of native coronary artery without angina pectoris: Secondary | ICD-10-CM | POA: Diagnosis present

## 2020-09-30 DIAGNOSIS — R6520 Severe sepsis without septic shock: Secondary | ICD-10-CM | POA: Diagnosis not present

## 2020-09-30 DIAGNOSIS — J690 Pneumonitis due to inhalation of food and vomit: Secondary | ICD-10-CM | POA: Diagnosis present

## 2020-09-30 DIAGNOSIS — R319 Hematuria, unspecified: Secondary | ICD-10-CM | POA: Diagnosis present

## 2020-09-30 DIAGNOSIS — J9601 Acute respiratory failure with hypoxia: Secondary | ICD-10-CM | POA: Diagnosis present

## 2020-09-30 DIAGNOSIS — J9602 Acute respiratory failure with hypercapnia: Secondary | ICD-10-CM | POA: Diagnosis present

## 2020-09-30 DIAGNOSIS — G9340 Encephalopathy, unspecified: Secondary | ICD-10-CM | POA: Diagnosis present

## 2020-09-30 DIAGNOSIS — Z953 Presence of xenogenic heart valve: Secondary | ICD-10-CM | POA: Diagnosis not present

## 2020-09-30 DIAGNOSIS — Z20822 Contact with and (suspected) exposure to covid-19: Secondary | ICD-10-CM | POA: Diagnosis present

## 2020-09-30 DIAGNOSIS — I272 Pulmonary hypertension, unspecified: Secondary | ICD-10-CM | POA: Diagnosis present

## 2020-09-30 DIAGNOSIS — J440 Chronic obstructive pulmonary disease with acute lower respiratory infection: Secondary | ICD-10-CM | POA: Diagnosis present

## 2020-09-30 DIAGNOSIS — N390 Urinary tract infection, site not specified: Secondary | ICD-10-CM | POA: Diagnosis present

## 2020-09-30 DIAGNOSIS — E785 Hyperlipidemia, unspecified: Secondary | ICD-10-CM | POA: Diagnosis present

## 2020-09-30 LAB — BLOOD CULTURE ID PANEL (REFLEXED) - BCID2

## 2020-09-30 LAB — I-STAT ARTERIAL BLOOD GAS, ED
Acid-Base Excess: 0 mmol/L (ref 0.0–2.0)
Bicarbonate: 27.2 mmol/L (ref 20.0–28.0)
Calcium, Ion: 1.16 mmol/L (ref 1.15–1.40)
HCT: 42 % (ref 36.0–46.0)
Hemoglobin: 14.3 g/dL (ref 12.0–15.0)
O2 Saturation: 95 %
Patient temperature: 102.3
Potassium: 4.1 mmol/L (ref 3.5–5.1)
Sodium: 135 mmol/L (ref 135–145)
TCO2: 29 mmol/L (ref 22–32)
pCO2 arterial: 59.8 mmHg — ABNORMAL HIGH (ref 32.0–48.0)
pH, Arterial: 7.275 — ABNORMAL LOW (ref 7.350–7.450)
pO2, Arterial: 95 mmHg (ref 83.0–108.0)

## 2020-09-30 LAB — ECHOCARDIOGRAM COMPLETE
AR max vel: 1.7 cm2
AV Area VTI: 2.15 cm2
AV Area mean vel: 1.76 cm2
AV Mean grad: 13 mmHg
AV Peak grad: 24.7 mmHg
Ao pk vel: 2.48 m/s
Area-P 1/2: 3.89 cm2
Height: 64 in
MV VTI: 2.13 cm2
S' Lateral: 3.1 cm
Weight: 2400 oz

## 2020-09-30 LAB — CBC
HCT: 42.2 % (ref 36.0–46.0)
Hemoglobin: 13.6 g/dL (ref 12.0–15.0)
MCH: 31.7 pg (ref 26.0–34.0)
MCHC: 32.2 g/dL (ref 30.0–36.0)
MCV: 98.4 fL (ref 80.0–100.0)
Platelets: 201 10*3/uL (ref 150–400)
RBC: 4.29 MIL/uL (ref 3.87–5.11)
RDW: 13.6 % (ref 11.5–15.5)
WBC: 18.6 10*3/uL — ABNORMAL HIGH (ref 4.0–10.5)
nRBC: 0 % (ref 0.0–0.2)

## 2020-09-30 LAB — I-STAT VENOUS BLOOD GAS, ED
Acid-Base Excess: 1 mmol/L (ref 0.0–2.0)
Bicarbonate: 27.4 mmol/L (ref 20.0–28.0)
Calcium, Ion: 0.98 mmol/L — ABNORMAL LOW (ref 1.15–1.40)
HCT: 38 % (ref 36.0–46.0)
Hemoglobin: 12.9 g/dL (ref 12.0–15.0)
O2 Saturation: 63 %
Potassium: 4.1 mmol/L (ref 3.5–5.1)
Sodium: 136 mmol/L (ref 135–145)
TCO2: 29 mmol/L (ref 22–32)
pCO2, Ven: 49.2 mmHg (ref 44.0–60.0)
pH, Ven: 7.354 (ref 7.250–7.430)
pO2, Ven: 35 mmHg (ref 32.0–45.0)

## 2020-09-30 LAB — BLOOD GAS, ARTERIAL
Acid-base deficit: 1 mmol/L (ref 0.0–2.0)
Bicarbonate: 23.5 mmol/L (ref 20.0–28.0)
FIO2: 60
O2 Saturation: 98.8 %
Patient temperature: 36.9
pCO2 arterial: 41.1 mmHg (ref 32.0–48.0)
pH, Arterial: 7.376 (ref 7.350–7.450)
pO2, Arterial: 138 mmHg — ABNORMAL HIGH (ref 83.0–108.0)

## 2020-09-30 LAB — COMPREHENSIVE METABOLIC PANEL
ALT: 20 U/L (ref 0–44)
AST: 19 U/L (ref 15–41)
Albumin: 2.9 g/dL — ABNORMAL LOW (ref 3.5–5.0)
Alkaline Phosphatase: 94 U/L (ref 38–126)
Anion gap: 13 (ref 5–15)
BUN: 27 mg/dL — ABNORMAL HIGH (ref 8–23)
CO2: 25 mmol/L (ref 22–32)
Calcium: 8.6 mg/dL — ABNORMAL LOW (ref 8.9–10.3)
Chloride: 98 mmol/L (ref 98–111)
Creatinine, Ser: 1.69 mg/dL — ABNORMAL HIGH (ref 0.44–1.00)
GFR, Estimated: 30 mL/min — ABNORMAL LOW (ref >60)
Glucose, Bld: 206 mg/dL — ABNORMAL HIGH (ref 70–99)
Potassium: 4.3 mmol/L (ref 3.5–5.1)
Sodium: 136 mmol/L (ref 135–145)
Total Bilirubin: 1.6 mg/dL — ABNORMAL HIGH (ref 0.3–1.2)
Total Protein: 6.1 g/dL — ABNORMAL LOW (ref 6.5–8.1)

## 2020-09-30 LAB — CBG MONITORING, ED
Glucose-Capillary: 171 mg/dL — ABNORMAL HIGH (ref 70–99)
Glucose-Capillary: 203 mg/dL — ABNORMAL HIGH (ref 70–99)

## 2020-09-30 LAB — GLUCOSE, CAPILLARY
Glucose-Capillary: 202 mg/dL — ABNORMAL HIGH (ref 70–99)
Glucose-Capillary: 205 mg/dL — ABNORMAL HIGH (ref 70–99)
Glucose-Capillary: 235 mg/dL — ABNORMAL HIGH (ref 70–99)
Glucose-Capillary: 240 mg/dL — ABNORMAL HIGH (ref 70–99)
Glucose-Capillary: 217 mg/dL — ABNORMAL HIGH (ref 70–99)

## 2020-09-30 LAB — MAGNESIUM: Magnesium: 1.7 mg/dL (ref 1.7–2.4)

## 2020-09-30 LAB — HEMOGLOBIN A1C
Hgb A1c MFr Bld: 7 % — ABNORMAL HIGH (ref 4.8–5.6)
Mean Plasma Glucose: 154.2 mg/dL

## 2020-09-30 LAB — MRSA PCR SCREENING: MRSA by PCR: NEGATIVE

## 2020-09-30 LAB — PROTIME-INR
INR: 1.8 — ABNORMAL HIGH (ref 0.8–1.2)
Prothrombin Time: 20.9 seconds — ABNORMAL HIGH (ref 11.4–15.2)

## 2020-09-30 LAB — LACTIC ACID, PLASMA: Lactic Acid, Venous: 1.8 mmol/L (ref 0.5–1.9)

## 2020-09-30 LAB — TROPONIN I (HIGH SENSITIVITY)
Troponin I (High Sensitivity): 54 ng/L — ABNORMAL HIGH (ref <18)
Troponin I (High Sensitivity): 201 ng/L (ref <18)
Troponin I (High Sensitivity): 720 ng/L (ref <18)

## 2020-09-30 LAB — BRAIN NATRIURETIC PEPTIDE: B Natriuretic Peptide: 384.7 pg/mL — ABNORMAL HIGH (ref 0.0–100.0)

## 2020-09-30 MED ORDER — METOPROLOL TARTRATE 5 MG/5ML IV SOLN: 2.5000 mg | Freq: Three times a day (TID) | INTRAVENOUS | Status: DC

## 2020-09-30 MED ORDER — NOREPINEPHRINE 4 MG/250ML-% IV SOLN
2.0000 ug/min | INTRAVENOUS | Status: DC
  Administered 2020-09-30: 3 ug/min via INTRAVENOUS
  Filled 2020-09-30 (×2): qty 250

## 2020-09-30 MED ORDER — WHITE PETROLATUM EX OINT
TOPICAL_OINTMENT | CUTANEOUS | Status: AC
  Filled 2020-09-30: qty 28.35

## 2020-09-30 MED ORDER — VANCOMYCIN HCL 1000 MG/200ML IV SOLN: 1000.0000 mg | INTRAVENOUS | Status: DC

## 2020-09-30 MED ORDER — PERFLUTREN LIPID MICROSPHERE
1.0000 mL | INTRAVENOUS | Status: AC | PRN
  Filled 2020-09-30: qty 10

## 2020-09-30 MED ORDER — CHLORHEXIDINE GLUCONATE CLOTH 2 % EX PADS
6.0000 | MEDICATED_PAD | Freq: Every day | CUTANEOUS | Status: DC
  Administered 2020-09-30 – 2020-10-04 (×4): 6 via TOPICAL

## 2020-09-30 MED ORDER — SODIUM CHLORIDE 0.9 % IV BOLUS
500.0000 mL | Freq: Once | INTRAVENOUS | Status: AC
  Administered 2020-09-30: 500 mL via INTRAVENOUS

## 2020-09-30 MED ORDER — INSULIN ASPART 100 UNIT/ML ~~LOC~~ SOLN
0.0000 [IU] | SUBCUTANEOUS | Status: DC
  Administered 2020-09-30 – 2020-10-01 (×7): 3 [IU] via SUBCUTANEOUS
  Administered 2020-10-01 (×2): 2 [IU] via SUBCUTANEOUS
  Administered 2020-10-01: 3 [IU] via SUBCUTANEOUS
  Administered 2020-10-02 (×2): 2 [IU] via SUBCUTANEOUS
  Administered 2020-10-02 (×3): 3 [IU] via SUBCUTANEOUS
  Administered 2020-10-02: 5 [IU] via SUBCUTANEOUS
  Administered 2020-10-02: 2 [IU] via SUBCUTANEOUS

## 2020-09-30 MED ORDER — PANTOPRAZOLE SODIUM 40 MG IV SOLR
40.0000 mg | INTRAVENOUS | Status: DC
  Administered 2020-10-01: 40 mg via INTRAVENOUS
  Filled 2020-09-30: qty 40

## 2020-09-30 MED ORDER — DILTIAZEM HCL 30 MG PO TABS
30.0000 mg | ORAL_TABLET | Freq: Four times a day (QID) | ORAL | Status: DC
  Administered 2020-09-30: 30 mg via ORAL
  Filled 2020-09-30 (×2): qty 1

## 2020-09-30 MED ORDER — INSULIN DETEMIR 100 UNIT/ML ~~LOC~~ SOLN
10.0000 [IU] | Freq: Two times a day (BID) | SUBCUTANEOUS | Status: DC
  Administered 2020-10-01 – 2020-10-04 (×7): 10 [IU] via SUBCUTANEOUS
  Filled 2020-09-30 (×10): qty 0.1

## 2020-09-30 MED ORDER — METHYLPREDNISOLONE SODIUM SUCC 125 MG IJ SOLR
80.0000 mg | Freq: Once | INTRAMUSCULAR | Status: AC
  Administered 2020-09-30: 80 mg via INTRAVENOUS
  Filled 2020-09-30: qty 2

## 2020-09-30 MED ORDER — FUROSEMIDE 10 MG/ML IJ SOLN
40.0000 mg | Freq: Once | INTRAMUSCULAR | Status: AC
  Administered 2020-09-30: 40 mg via INTRAVENOUS
  Filled 2020-09-30: qty 4

## 2020-09-30 MED ORDER — IPRATROPIUM-ALBUTEROL 0.5-2.5 (3) MG/3ML IN SOLN: 3.0000 mL | Freq: Four times a day (QID) | RESPIRATORY_TRACT | Status: DC | PRN

## 2020-09-30 MED ORDER — GABAPENTIN 300 MG PO CAPS: 300.0000 mg | ORAL_CAPSULE | Freq: Two times a day (BID) | ORAL | Status: DC

## 2020-09-30 MED ORDER — IPRATROPIUM-ALBUTEROL 0.5-2.5 (3) MG/3ML IN SOLN
3.0000 mL | Freq: Once | RESPIRATORY_TRACT | Status: AC
  Administered 2020-09-30: 3 mL via RESPIRATORY_TRACT
  Filled 2020-09-30: qty 3

## 2020-09-30 MED ORDER — INSULIN GLARGINE 100 UNIT/ML ~~LOC~~ SOLN
20.0000 [IU] | Freq: Once | SUBCUTANEOUS | Status: AC
  Administered 2020-09-30: 20 [IU] via SUBCUTANEOUS
  Filled 2020-09-30: qty 0.2

## 2020-09-30 MED ORDER — SODIUM CHLORIDE 0.9 % IV SOLN: 250.0000 mL | INTRAVENOUS | Status: DC

## 2020-09-30 MED ORDER — VANCOMYCIN HCL 1250 MG/250ML IV SOLN
1250.0000 mg | INTRAVENOUS | Status: AC
  Administered 2020-09-30: 1250 mg via INTRAVENOUS
  Filled 2020-09-30: qty 250

## 2020-09-30 NOTE — ED Provider Notes (Signed)
Patient holding in the ED for admission.  She had acute onset of respiratory distress with desaturation to the 76 range.  This was after she returned from CT scan.  She was placed on a nonrebreather by her nurse.  On exam she is tachypneic, tachycardic, crackles and wheezes throughout.  Patient placed on BiPAP.  She is given a dose of IV Lasix and IV steroids and started on a breathing treatment.  Repeat chest x-ray and EKG will be obtained.  Patient tolerating BiPAP well.  She is given a dose of IV Lasix as well as steroids. Concern for possible volume overload versus aspiration.  Dr. Toniann Fail at bedside. Patient does indicate she wishes to be a full code. Understands that intubation is a possibility but she seems to be improving at this point.\\Dr. Toniann Fail updated at bedside as well  We will hold off on intubation at this time.  Dr. Toniann Fail for consult critical care.  CRITICAL CARE Performed by: Glynn Octave Total critical care time: 35 minutes Critical care time was exclusive of separately billable procedures and treating other patients. Critical care was necessary to treat or prevent imminent or life-threatening deterioration. Critical care was time spent personally by me on the following activities: development of treatment plan with patient and/or surrogate as well as nursing, discussions with consultants, evaluation of patient's response to treatment, examination of patient, obtaining history from patient or surrogate, ordering and performing treatments and interventions, ordering and review of laboratory studies, ordering and review of radiographic studies, pulse oximetry and re-evaluation of patient's condition.    Glynn Octave, MD 09/30/20 [phone]

## 2020-09-30 NOTE — Consult Note (Addendum)
NAME:  Brianna Lowe, MRN:  [id number], DOB:  11-18-1934, LOS: 0 ADMISSION DATE:  09/29/2020, CONSULTATION DATE:  09/30/2020 REFERRING MD:  Dr. Toniann Fail, CHIEF COMPLAINT:  Resp distress  History of Present Illness:  HPI obtained from medical chart review and limited from patient has she is on NIV.  86 year old female presenting from independent living with complaints of one day history of nausea, vomiting, weakness, and poor PO intake.  Appears to be recently seen for a pruritic rash on her upper chest (ongoing for 2 months) and recently treated for UTI with keflex on 3/4.    She was found on workup to be febrile 103, requiring 2L O2, initial CXR showing small left pleural effusion and left basilar atelectasis with moderate cardiomegaly.  Labs noted for WBC 17.3, glucose 206, BUN 27, sCr 1.69 (previously 0.82 07/2020), albumin 2.9, protein 6.1, BNP 384, troponin hs 54, lactic acid 2-> 1.8, neg COVID, urine showing large leukocytes with many bacteria and > 50 WBC, mod Hgb, 5 ketones, 100 protein.  Blood and urine cultures sent. She was empirically started on azithromycin, ceftriaxone, and vancomycin.  CT renal stone study without acute findings.  Treated with 1L NS in ER.  EKG showing ST and repeat with LBBB in which patient has hx of same.  Has denied SOB or chest pain. TRH to admit for sepsis.  However, she developed further respiratory distress with increased work of breathing and encephalopathic and was placed on BiPAP and given lasix 40mg .  Follow up CXR questions new patchy right lung opacity, concerning for aspiration.  ABG noted 7.275/ 59.8/ 95/ 27.   PCCM called for ICU evaluation.   Pertinent  Medical History  Former smoker, COPD- not on home O2, AS s/p TAVR bioprosthetic valve 05/2020, PAF on Eliquis, HTN, HLD, LBBB, DMT2, moderate MS, CAD, left femur fracture 06/2020, TIA  Significant Hospital Events: Including procedures, antibiotic start and stop dates in addition to other pertinent  events   . Initially admitted to [facility] for sepsis/ fever, UTI, possible aspiration PNA, but developed progressive resp distress on BiPAP, on vanc/ ctx/ azithro,  PCCM consulted-> ICU for close monitoring   Interim History / Subjective:  Currently on NIV 12/6, 50%, denies SOB, just doesn't like NIV mask  Objective   Blood pressure (!) 134/56, pulse (!) 132, temperature (!) 102.3 F (39.1 C), temperature source Rectal, resp. rate (!) 35, height 5\' 4"  (1.626 m), weight 68 kg, SpO2 95 %.       No intake or output data in the 24 hours ending 09/30/20 0754 Filed Weights   09/29/20 1813  Weight: 68 kg   Examination: General:  Elderly obese female sitting upright to the left in ER stretcher on NIV in mild distress HEENT: full face mask Neuro: Awake, answers some questions, f/c, MAE CV: ST,  PULM:  tachypneic on NIV, clear anteriorly/ diminished, faint bibasilar rales, no wheeze GI: soft, bs active, NT Extremities: warm/dry, no LE edema - does not appear volume overloaded  Skin: maculopapular rash to upper chest    Labs/imaging that I havepersonally reviewed  (right click and "Reselect all SmartList Selections" daily)  4/15 CT renal stone >> Limited examination without evidence of acute intra-abdominal pathology. No definite radiographic explanation for the patient's reported symptoms.  Cholelithiasis. Distal colonic diverticulosis.  Aortic Atherosclerosis   CBC, CMET, CXR, and micro data reviewed  Resolved Hospital Problem list    Assessment & Plan:   Acute hypoxic/ hypercarbic respiratory failure LLL PNA,  suspected aspiration vs CAP  Hx ?COPD, doubtful AE- no wheezing on exam  - will admit to ICU for close monitoring, high risk for intubation  - NPO for now - patient continues to denies SOB but has some visible work of breathing - will change to HFNC, HHFNC if WOB becomes an issues, minimal O2 requirements at this time - prn BIPAP if needed, but if any further nausea will avoid,  however at this time she is awake and mentating - ongoing pulmonary hygiene - s/p lasix in ER - abx as above - CXR in am  - consider SLP evaluation  - doubtful for PE given she is chronically anticoagulated on Eliquis  - hold on further steroids, s/p 80mg  of solumedrol in ER - prn BD   Sepsis secondary to UTI and LLL PNA, possibly aspiration given hx  - remains normotensive, goal MAP > 65 - lactate clearing  - check MRSA, if neg, d/c vanc, continue otherwise vanc, azithro, and ctx for now - trend WBC/ fever curve - tylenol for fever, will help with tachycardia    AKI  - likely pre-renal given N/V, poor PO intake - stop MIVF, s/p lasix in ER - CT renal neg - trend renal indices - strict I/Os   Hx HTN - stop long acting metoprolol and cardizem in case we run into blood pressure issues, will change to shorter acting meds with holding parameters    Hx PAF - currently ST, likely exacerbated by fever  - continue eliquis for now    Hx CAD, AS s/p TAVR 05/2020, moderate MS - continue statin - repeat TTE - trend troponin hs - EKG showing LBBB, not new per records, no complaints of CP  Addendum: second troponin hs 54-> 201, still no complaints of CP, given recent TAVR at [facility], will consult cards for further recs  DMT2, uncontrolled - SSI sensitive - hold home lantus 72 units while NPO, s/p lantus 20 units in ER overnight - continue with  levemir 10 units BID for now, starting tonight  Upper chest rash - ongoing for 2 months - unclear etiology   Best practice (right click and "Reselect all SmartList Selections" daily)  Diet:  NPO Pain/Anxiety/Delirium protocol (if indicated): No VAP protocol (if indicated): Not indicated DVT prophylaxis: Systemic AC GI prophylaxis: PPI Glucose control:  SSI Yes Central venous access:  N/A Arterial line:  N/A Foley:  N/A Mobility:  bed rest  PT consulted: N/A Last date of multidisciplinary goals of care discussion: patient  confirmed full code.  States her son to be [facility], however brother, [facility] listed in emergency contacts Code Status:  full code Disposition:  Admit to ICU  Addendum: patients son, Claire Dolores, [phone], at bedside and updated by myself and Dr. [phone].  Demographics updated.    Labs   CBC: Recent Labs  Lab 09/29/20 1820 09/30/20 0501 09/30/20 0712  WBC 17.3* 18.6*  --   NEUTROABS 15.7*  --   --   HGB 13.9 13.6 14.3  HCT 42.9 42.2 42.0  MCV 95.8 98.4  --   PLT 200 201  --     Basic Metabolic Panel: Recent Labs  Lab 09/29/20 1820 09/30/20 0501 09/30/20 0712  NA 132* 136 135  K 5.0 4.3 4.1  CL 96* 98  --   CO2 23 25  --   GLUCOSE 214* 206*  --   BUN 25* 27*  --   CREATININE 1.24* 1.69*  --   CALCIUM 8.3* 8.6*  --  GFR: Estimated Creatinine Clearance: 23.9 mL/min (A) (by C-G formula based on SCr of 1.69 mg/dL (H)). Recent Labs  Lab 09/29/20 1820 09/29/20 2023 09/30/20 0452 09/30/20 0501  WBC 17.3*  --   --  18.6*  LATICACIDVEN  --  2.0* 1.8  --     Liver Function Tests: Recent Labs  Lab 09/29/20 1820 09/30/20 0501  AST 25 19  ALT 15 20  ALKPHOS 99 94  BILITOT 3.4* 1.6*  PROT 5.9* 6.1*  ALBUMIN 2.9* 2.9*   Recent Labs  Lab 09/29/20 1820  LIPASE 26   No results for input(s): AMMONIA in the last 168 hours.  ABG    Component Value Date/Time   PHART 7.275 (L) 09/30/2020 0712   PCO2ART 59.8 (H) 09/30/2020 0712   PO2ART 95 09/30/2020 0712   HCO3 27.2 09/30/2020 0712   TCO2 29 09/30/2020 0712   O2SAT 95.0 09/30/2020 0712     Coagulation Profile: No results for input(s): INR, PROTIME in the last 168 hours.  Cardiac Enzymes: No results for input(s): CKTOTAL, CKMB, CKMBINDEX, TROPONINI in the last 168 hours.  HbA1C: No results found for: HGBA1C  CBG: Recent Labs  Lab 09/29/20 1808 09/30/20 0044 09/30/20 0747  GLUCAP 204* 171* 203*    Review of Systems:   Limited given patient on NIV, as per HPI   Past Medical History:  She,   has a past medical history of Atrial fibrillation (HCC), AV block, CAD (coronary artery disease), Congestive heart failure (CHF) (HCC), COPD (chronic obstructive pulmonary disease) (HCC), Diabetes (HCC), GERD (gastroesophageal reflux disease), Hypertension, Left bundle branch block, Localized edema, TIA (transient ischemic attack), and Vitamin D deficiency.   Surgical History:   Past Surgical History:  Procedure Laterality Date  . heart valve    . REPLACEMENT TOTAL KNEE BILATERAL       Social History:   reports that she has quit smoking. She has never used smokeless tobacco. She reports that she does not drink alcohol and does not use drugs.   Family History:  Her family history is negative for Diabetes Mellitus II.   Allergies Allergies  Allergen Reactions  . Doxycycline Anaphylaxis  . Aspirin Other (See Comments)    Advised not to use due to family hx of macular degeneration Advised not to use due to family hx of macular degeneration   . Codeine   . Tramadol   . Ace Inhibitors Rash  . Hydrocodone Nausea And Vomiting  . Hydrocodone-Acetaminophen Nausea And Vomiting  . Oxycodone-Acetaminophen Nausea And Vomiting  . Propoxyphene Nausea And Vomiting  . Statins Rash  . Sulfa Antibiotics Rash     Home Medications  Prior to Admission medications   Medication Sig Start Date End Date Taking? Authorizing Provider  albuterol (VENTOLIN HFA) 108 (90 Base) MCG/ACT inhaler Inhale 2 puffs into the lungs every 6 (six) hours as needed for wheezing or shortness of breath. 12/30/19  Yes [provider]  amoxicillin (AMOXIL) 500 MG capsule Take 2,000 mg by mouth as needed (prior to dental appointment). 06/04/20  Yes [provider]  aspirin 81 MG chewable tablet Chew 1 tablet by mouth daily. 12/24/19  Yes [provider]  calcium-vitamin D (OSCAL WITH D) 500-200 MG-UNIT TABS tablet Take 1 tablet by mouth daily. 12/24/19  Yes [provider]  diltiazem (CARDIZEM  CD) 120 MG 24 hr capsule Take 120 mg by mouth daily. 07/07/20  Yes [provider]  ELIQUIS 5 MG TABS tablet Take 5 mg by mouth 2 (  two) times daily. 07/15/20  Yes [provider]  ezetimibe (ZETIA) 10 MG tablet Take 10 mg by mouth daily. 07/15/20  Yes [provider]  furosemide (LASIX) 40 MG tablet Take 40 mg by mouth daily. 04/08/20  Yes [provider]  gabapentin (NEURONTIN) 300 MG capsule Take 300 mg by mouth in the morning and at bedtime. 07/10/20  Yes [provider]  glipiZIDE (GLUCOTROL XL) 5 MG 24 hr tablet Take 5 mg by mouth daily. 07/10/20  Yes [provider]  insulin glargine, 2 Unit Dial, (TOUJEO MAX SOLOSTAR) 300 UNIT/ML Solostar Pen Inject 72 Units into the skin at bedtime. 04/12/20  Yes [provider]  Melatonin 5 MG CAPS Take 5 mg by mouth at bedtime. 01/13/20  Yes [provider]  metoprolol succinate (TOPROL-XL) 100 MG 24 hr tablet Take 100 mg by mouth daily. 07/07/20  Yes [provider]  OZEMPIC, 0.25 OR 0.5 MG/DOSE, 2 MG/1.5ML SOPN Inject 0.5 mg into the skin every Monday. 09/26/20  Yes [provider]  pantoprazole (PROTONIX) 40 MG tablet Take 40 mg by mouth daily. 07/15/20  Yes [provider]  spironolactone (ALDACTONE) 25 MG tablet Take 25 mg by mouth in the morning and at bedtime. 04/08/20  Yes [provider]  cephALEXin (KEFLEX) 500 MG capsule Take 500 mg by mouth See admin instructions. Bid x 5 days Patient not taking: No sig reported 08/19/20   [provider]     Critical care time: 40 mins       Posey BoyerBrooke Alphonse Asbridge, ACNP [facility] 09/30/2020, 7:54 AM See Amion for pager

## 2020-09-30 NOTE — Progress Notes (Signed)
Dr Everardo All at bedside and request pt to be placed on HFNC.  Pt is awake and requesting off bipap.  Pt does appear to have abdominal breathing (this was noted on bipap also), but pt denies SOB x 2, pt states her breathing is "okay" x 2.  Sat 95% currently, RN at bedside w/ pt and aware (MD discussed this w/ RN also).

## 2020-09-30 NOTE — Progress Notes (Signed)
PHARMACY - PHYSICIAN COMMUNICATION CRITICAL VALUE ALERT - BLOOD CULTURE IDENTIFICATION (BCID)  Brianna Lowe is an 86 y.o. female who presented to [facility] on 09/29/2020 with a chief complaint of weakness and nausea. Concerned for aspiration pneumonia currently on ceftriazone/azithromycin/vancomycin. Bcx now positive for E. Coli in 2/4 bottles. MRSA PCR negative, so would recommend stopping vancomycin if concerned for pneumonia.  Assessment:  Bcx 2/4 bottles growing E. Coli (no resistance detected)  Name of physician (or Provider) Contacted: Jeanella Cara, PharmD  Current antibiotics: ceftriaxone, azithromycin, vancomycin  Changes to prescribed antibiotics recommended:  Recommend stopping vancomycin and azithromycin  Continue ceftriaxone   Results for orders placed or performed during the hospital encounter of 09/29/20  Blood Culture ID Panel (Reflexed) (Collected: 09/29/2020  9:10 PM)  Result Value Ref Range   Enterococcus faecalis NOT DETECTED NOT DETECTED   Enterococcus Faecium NOT DETECTED NOT DETECTED   Listeria monocytogenes NOT DETECTED NOT DETECTED   Staphylococcus species NOT DETECTED NOT DETECTED   Staphylococcus aureus (BCID) NOT DETECTED NOT DETECTED   Staphylococcus epidermidis NOT DETECTED NOT DETECTED   Staphylococcus lugdunensis NOT DETECTED NOT DETECTED   Streptococcus species NOT DETECTED NOT DETECTED   Streptococcus agalactiae NOT DETECTED NOT DETECTED   Streptococcus pneumoniae NOT DETECTED NOT DETECTED   Streptococcus pyogenes NOT DETECTED NOT DETECTED   A.calcoaceticus-baumannii NOT DETECTED NOT DETECTED   Bacteroides fragilis NOT DETECTED NOT DETECTED   Enterobacterales DETECTED (A) NOT DETECTED   Enterobacter cloacae complex NOT DETECTED NOT DETECTED   Escherichia coli DETECTED (A) NOT DETECTED   Klebsiella aerogenes NOT DETECTED NOT DETECTED   Klebsiella oxytoca NOT DETECTED NOT DETECTED   Klebsiella pneumoniae NOT DETECTED NOT DETECTED   Proteus  species NOT DETECTED NOT DETECTED   Salmonella species NOT DETECTED NOT DETECTED   Serratia marcescens NOT DETECTED NOT DETECTED   Haemophilus influenzae NOT DETECTED NOT DETECTED   Neisseria meningitidis NOT DETECTED NOT DETECTED   Pseudomonas aeruginosa NOT DETECTED NOT DETECTED   Stenotrophomonas maltophilia NOT DETECTED NOT DETECTED   Candida albicans NOT DETECTED NOT DETECTED   Candida auris NOT DETECTED NOT DETECTED   Candida glabrata NOT DETECTED NOT DETECTED   Candida krusei NOT DETECTED NOT DETECTED   Candida parapsilosis NOT DETECTED NOT DETECTED   Candida tropicalis NOT DETECTED NOT DETECTED   Cryptococcus neoformans/gattii NOT DETECTED NOT DETECTED   CTX-M ESBL NOT DETECTED NOT DETECTED   Carbapenem resistance IMP NOT DETECTED NOT DETECTED   Carbapenem resistance KPC NOT DETECTED NOT DETECTED   Carbapenem resistance NDM NOT DETECTED NOT DETECTED   Carbapenem resist OXA 48 LIKE NOT DETECTED NOT DETECTED   Carbapenem resistance VIM NOT DETECTED NOT DETECTED    Jennette Kettle 09/30/2020  1:08 PM

## 2020-09-30 NOTE — ED Notes (Signed)
Dr. Lars Masson and Dr. Manus Gunning at bedside.

## 2020-09-30 NOTE — Progress Notes (Signed)
Pharmacy Antibiotic Note  Brianna Lowe is a 86 y.o. female admitted on 09/29/2020 with sepsis.  Pharmacy has been consulted for Vancomycin dosing. Pt also on Azithromycin and Ceftriaxone.  Plan: Vancomycin 1250 mg IV now then 1000 mg IV Q 48 hrs. Goal AUC 400-550. Expected AUC: 450 SCr used: 1.69 Will f/u renal function, micro data, and pt's clinical condition Vanc levels prn   Height: 5\' 4"  (162.6 cm) Weight: 68 kg (150 lb) IBW/kg (Calculated) : 54.7  Temp (24hrs), Avg:100.1 F (37.8 C), Min:98 F (36.7 C), Max:103 F (39.4 C)  Recent Labs  Lab 09/29/20 1820 09/29/20 2023 09/30/20 0452 09/30/20 0501  WBC 17.3*  --   --  18.6*  CREATININE 1.24*  --   --  1.69*  LATICACIDVEN  --  2.0* 1.8  --     Estimated Creatinine Clearance: 23.9 mL/min (A) (by C-G formula based on SCr of 1.69 mg/dL (H)).    Allergies  Allergen Reactions  . Doxycycline Anaphylaxis  . Aspirin Other (See Comments)    Advised not to use due to family hx of macular degeneration Advised not to use due to family hx of macular degeneration   . Codeine   . Tramadol   . Ace Inhibitors Rash  . Hydrocodone Nausea And Vomiting  . Hydrocodone-Acetaminophen Nausea And Vomiting  . Oxycodone-Acetaminophen Nausea And Vomiting  . Propoxyphene Nausea And Vomiting  . Statins Rash  . Sulfa Antibiotics Rash    Antimicrobials this admission: 4/15 Vanc >>  4/14 Azith >>  4/14 Rocephin >>  Microbiology results: 4/14 BCx:   Thank you for allowing pharmacy to be a part of this patient's care.  5/14, PharmD, BCPS Please see amion for complete clinical pharmacist phone list 09/30/2020 6:56 AM

## 2020-09-30 NOTE — Progress Notes (Signed)
Patient admitted by nighttime hospitalist from ER.  She had decompensation early morning, needed to be on heated high flow with respiratory distress.  Seen by critical care and admitted to critical care.  TRH will sign off and resume care when she is stabilized and able to come out from ICU.

## 2020-09-30 NOTE — ED Notes (Signed)
Notified Dr. Toniann Fail of Pt's CBG of 171. New order received to change lantus dose to 20 units for tonight.

## 2020-09-30 NOTE — Progress Notes (Signed)
Came to room to set up Heated high flow [place] (per discussion w/ CCM and per order), RN in room and states pt is going to [facility] ICU.  Pt was not placed on HHFNC d/t pt being transported soon.  ICU  RT given report and aware.  No distress noted currently, pt continues to deny SOB, sat 98% on 10 L [place] HFNC.

## 2020-09-30 NOTE — Progress Notes (Signed)
  Notified on progressive hypotension despite NS bolus with increased lethargy, no increased WOB.   ABG obtained, reassuring at 7.376/ 41/ 138/ 23 on [place] HFNC 10L.  Glucose 203.  Bladder scan 100 ml  Encephalopathy likely related to hypotension +/- sepsis   P:  Peripheral NE for MAP goal > 65      Posey Boyer, ACNP [facility] 09/30/2020, 12:51 PM

## 2020-09-30 NOTE — ED Notes (Signed)
S/W Dr. Toniann Fail about pt's hypoxemia and HR in 140's. MD advised to place pt on bipap, obtain ABG, and he will be in to assess pt.

## 2020-09-30 NOTE — ED Notes (Addendum)
Came in to assess pt after return from CT. Pt in respiratory distress and O2 sat 76% on 2lpm via [place]. Pt placed on nonrebreather, RT called to bedside. Dr. Manus Gunning came to bedside and advised to start on bipap.

## 2020-09-30 NOTE — Progress Notes (Signed)
  Echocardiogram 2D Echocardiogram has been performed.  Brianna Lowe 09/30/2020, 3:12 PM

## 2020-09-30 NOTE — Consult Note (Addendum)
Cardiology Consultation:   Patient ID: Brianna Lowe; [id number]; 11/26/1934   Admit date: 09/29/2020 Date of Consult: 09/30/2020  Primary Care Provider: Pcp, No Primary Cardiologist: Dr. Gaetana Michaelis. [facility])  Patient Profile:   Brianna Lowe is a 86 y.o. female with a hx of aortic stenosis s/p TAVR 06/03/2020, mitral stenosis (rheumatic fever at 86yo), DM2, history of TIA, paroxysmal atrial fibrillation on chronic anticoagulation, borderline obstructive CAD with first diagonal lesion at 70% and a mid RCA lesion at 80% per cath 03/2020, HTN, HLD, carotid artery stenosis with a known 70% stenosis of the LICA, pulmonary hypertension and former tobacco use who is being seen today for the evaluation of elevated troponin at the request of Dr. Everardo All.  History of Present Illness:   Brianna Lowe is an 86 year old female with a history stated above who presented to [facility] on 09/30/2020 with nausea, vomiting and worsening fatigue for the last several days. Pt lives in an independent living facility at [facility] and was noted to have increasing fatigue with the above symptoms. Given this, she was brought to the ED for further evaluation. HPI difficult to obtain given mild confusion on exam therefore most information has been obtained through chart review.    In the ED, she was found to be febrile with a temp at 103 with initial CXR showing left pleural effusion with left basilar atelectasis and moderate cardiomegaly. Lab work revealed leukocytosis at 17.3 with a creatinine at 1.69 with a baseline which appears to be in the 0.8-0.9 range. BNP was found to be moderately elevated at 384.  High-sensitivity troponin at 54>>> 201>>>720 with no complaints of chest pain.  Urine screen showed large leukocytes with bacteria therefore blood and urine cultures obtained.  She was empirically started on broad-spectrum antibiotic therapy. CT renal stone study without acute abnormality. After hospital  admission, she developed further respiratory distress with encephalopathy and increased work of breathing therefore PCCM was consulted.  She was placed on BiPAP ventilation and was given IV Lasix 40 mg.  Repeat CXR showed new patchy right lung opacities concerning for aspiration. She is no longer on Bipap however had worsening hypotension therefore she has been placed on vasopressor support. She continues to deny chest pain or other anginal symptoms. She is asking to go home.   She has been followed by [facility] cardiology with Dr. Judithe Modest and Dr. Reuel Boom. It appears that they have been following her AS dating back to 2019 with an echocardiogram showing an LVEF of 55 to 60% with moderate aortic valve stenosis with mean gradient at 32 mmHg. Due to the development of DOE with minimal exertion she was referred to Dr. [facility] Boom for potential TAVR. Pre-TAVR cardiac catheterization performed 04/06/2020 showed borderline obstructive CAD with mid LAD lesion at 50%, first diagonal lesion at 70% and a mid RCA lesion at 80% per cath report. Also showed severe aortic stenosis with a mean gradient of 47 mmHg and aortic valve area of 0.59 cm. Given no history of angina no revascularization was pursued with plans to manage medically.   She was then seen by Dr. Reuel Boom 04/22/2020 in initial TAVR consultation at which time she was noted to have NYHA class IIIb heart failure symptoms. After much discussion with the patient and family, the plan was to proceed with TAVR.   She was most recently seen with cardiology follow-up 07/25/2020 at which time her biggest concern was her physical therapy.  At that time, she was unable to do much of her  housework without the use of her walker and wheelchair.  There was some discussion regarding potential rehab at [facility] however this appears to be an ongoing process.  She reported that her breathing had improved and she had no chest pain or palpitations.  Repeat echocardiogram 12/18/2019 showed  moderate to severe aortic valve stenosis with a dimensionless index of 0.17, mean gradient of 34 mmHg with the development of DOE with minimal exertion.  Past Medical History:  Diagnosis Date  . Atrial fibrillation (HCC)   . AV block   . CAD (coronary artery disease)   . Congestive heart failure (CHF) (HCC)   . COPD (chronic obstructive pulmonary disease) (HCC)   . Diabetes (HCC)   . GERD (gastroesophageal reflux disease)   . Hypertension   . Left bundle branch block   . Localized edema   . TIA (transient ischemic attack)   . Vitamin D deficiency     Past Surgical History:  Procedure Laterality Date  . heart valve    . REPLACEMENT TOTAL KNEE BILATERAL       Prior to Admission medications   Medication Sig Start Date End Date Taking? Authorizing Provider  albuterol (VENTOLIN HFA) 108 (90 Base) MCG/ACT inhaler Inhale 2 puffs into the lungs every 6 (six) hours as needed for wheezing or shortness of breath. 12/30/19  Yes [provider]  amoxicillin (AMOXIL) 500 MG capsule Take 2,000 mg by mouth as needed (prior to dental appointment). 06/04/20  Yes [provider]  aspirin 81 MG chewable tablet Chew 1 tablet by mouth daily. 12/24/19  Yes [provider]  calcium-vitamin D (OSCAL WITH D) 500-200 MG-UNIT TABS tablet Take 1 tablet by mouth daily. 12/24/19  Yes [provider]  diltiazem (CARDIZEM CD) 120 MG 24 hr capsule Take 120 mg by mouth daily. 07/07/20  Yes [provider]  ELIQUIS 5 MG TABS tablet Take 5 mg by mouth 2 (two) times daily. 07/15/20  Yes [provider]  ezetimibe (ZETIA) 10 MG tablet Take 10 mg by mouth daily. 07/15/20  Yes [provider]  furosemide (LASIX) 40 MG tablet Take 40 mg by mouth daily. 04/08/20  Yes [provider]  gabapentin (NEURONTIN) 300 MG capsule Take 300 mg by mouth in the morning and at bedtime. 07/10/20  Yes [provider]  glipiZIDE (GLUCOTROL XL) 5 MG 24 hr tablet Take 5  mg by mouth daily. 07/10/20  Yes [provider]  insulin glargine, 2 Unit Dial, (TOUJEO MAX SOLOSTAR) 300 UNIT/ML Solostar Pen Inject 72 Units into the skin at bedtime. 04/12/20  Yes [provider]  Melatonin 5 MG CAPS Take 5 mg by mouth at bedtime. 01/13/20  Yes [provider]  metoprolol succinate (TOPROL-XL) 100 MG 24 hr tablet Take 100 mg by mouth daily. 07/07/20  Yes [provider]  OZEMPIC, 0.25 OR 0.5 MG/DOSE, 2 MG/1.5ML SOPN Inject 0.5 mg into the skin every Monday. 09/26/20  Yes [provider]  pantoprazole (PROTONIX) 40 MG tablet Take 40 mg by mouth daily. 07/15/20  Yes [provider]  spironolactone (ALDACTONE) 25 MG tablet Take 25 mg by mouth in the morning and at bedtime. 04/08/20  Yes [provider]  cephALEXin (KEFLEX) 500 MG capsule Take 500 mg by mouth See admin instructions. Bid x 5 days Patient not taking: No sig reported 08/19/20   [provider]    Inpatient Medications: Scheduled Meds: . apixaban  5 mg Oral BID  . Chlorhexidine Gluconate Cloth  6 each Topical Daily  . ezetimibe  10 mg Oral Daily  . insulin aspart  0-9 Units Subcutaneous Q4H  . insulin detemir  10 Units Subcutaneous BID  . pantoprazole  40 mg Oral Daily   Continuous Infusions: . azithromycin Stopped (09/29/20 2306)  . cefTRIAXone (ROCEPHIN)  IV Stopped (09/29/20 2139)  . [START ON 10/02/2020] vancomycin     PRN Meds: acetaminophen **OR** acetaminophen, ipratropium-albuterol  Allergies:    Allergies  Allergen Reactions  . Doxycycline Anaphylaxis  . Aspirin Other (See Comments)    Advised not to use due to family hx of macular degeneration Advised not to use due to family hx of macular degeneration   . Codeine   . Tramadol   . Ace Inhibitors Rash  . Hydrocodone Nausea And Vomiting  . Hydrocodone-Acetaminophen Nausea And Vomiting  . Oxycodone-Acetaminophen Nausea And Vomiting  . Propoxyphene Nausea And Vomiting  .  Statins Rash  . Sulfa Antibiotics Rash    Social History:   Social History   Socioeconomic History  . Marital status: Widowed    Spouse name: Not on file  . Number of children: Not on file  . Years of education: Not on file  . Highest education level: Not on file  Occupational History  . Not on file  Tobacco Use  . Smoking status: Former Games developer  . Smokeless tobacco: Never Used  Substance and Sexual Activity  . Alcohol use: Never  . Drug use: Never  . Sexual activity: Not on file  Other Topics Concern  . Not on file  Social History Narrative  . Not on file   Social Determinants of Health   Financial Resource Strain: Not on file  Food Insecurity: Not on file  Transportation Needs: Not on file  Physical Activity: Not on file  Stress: Not on file  Social Connections: Not on file  Intimate Partner Violence: Not on file    Family History:   Family History  Problem Relation Age of Onset  . Diabetes Mellitus II Neg Hx    Family Status:  Family Status  Relation Name Status  . Neg Hx  (Not Specified)    ROS:  Please see the history of present illness.  All other ROS reviewed and negative.     Physical Exam/Data:   Vitals:   09/30/20 0915 09/30/20 1000 09/30/20 1011 09/30/20 1108  BP: 119/61 (!) 83/33 95/62   Pulse:  (!) 114 (!) 113   Resp:  (!) 25 (!) 24   Temp:    98.3 F (36.8 C)  TempSrc:    Oral  SpO2:  99% 100%   Weight:      Height:       No intake or output data in the 24 hours ending 09/30/20 1234 Filed Weights   09/29/20 1813  Weight: 68 kg   Body mass index is 25.75 kg/m.   General: Elderly, NAD Neck: Negative for carotid bruits. No JVD Lungs:Clear to ausculation bilaterally. Breathing is unlabored. Cardiovascular: RRR with S1 S2. No murmurs Abdomen: Soft, non-tender, non-distended. No obvious abdominal masses. Extremities: No edema. Radial pulses 2+ bilaterally Neuro: Alert and oriented. No focal deficits. No facial asymmetry. MAE  spontaneously. Psych: Responds to questions appropriately with normal affect.     EKG:  The EKG was personally reviewed and demonstrates:  09/30/20 ST with HR 143bpm, LBBB and no acute changes  Telemetry:  Telemetry was personally reviewed and demonstrates: 09/30/20 ST with HR in the low 100's   Relevant  CV Studies:  Echocardiogram 08/15/2020:  SUMMARY Left ventricular systolic function is normal. LV ejection fraction = 65-70%. No segmental wall motion abnormalities seen in the left ventricle The right ventricle is normal in size and function. The left atrial size is normal. 16mm Sapien bioprosthetic valve in the aortic position. Mean pressure gradient of 15 mmHg. There is no aortic regurgitation. There is mild aortic stenosis. There is moderate mitral stenosis. Mean transmitral pressure gradient of 9 mmHg. The heart rate for the mean mitral valve gradient is 94 BPM. There is no significant change in comparison with the last study.  FINDINGS: LEFT VENTRICLE Mild left ventricular hypertrophy. The left ventricular size is normal. Left ventricular systolic function is normal. LV ejection fraction = 65-70%. Left ventricular filling pattern is indeterminate. No segmental wall motion abnormalities seen in the left ventricle.  LV WALL MOTION  - RIGHT VENTRICLE The right ventricle is normal in size and function.  LEFT ATRIUM The left atrial size is normal.  RIGHT ATRIUM Right atrial size is normal. There is no Doppler evidence for a patent foramen ovale. - AORTIC VALVE 75mm Sapien bioprosthetic valve in the aortic position. Mean pressure gradient of 15 mmHg. There is no aortic regurgitation. There is mild aortic stenosis. - MITRAL VALVE There is moderate mitral valve thickening. There is moderate mitral annular calcification. Rheumatic mitral stenosis. There is moderate mitral stenosis. Mean transmitral pressure gradient of 9 mmHg. The heart rate for the mean mitral valve  gradient is 94 BPM. - TRICUSPID VALVE Structurally normal tricuspid valve. No tricuspid regurgitation. There was insufficient TR detected to calculate RV systolic pressure. - PULMONIC VALVE The pulmonic valve is not well visualized. There is no pulmonic valvular stenosis. There is no pulmonic valvular regurgitation. - ARTERIES The aortic sinus is normal size. The ascending aorta is not visualized. - VENOUS IVC size was normal. - EFFUSION There is no pericardial effusion. - -  MMode/2D Measurements & Calculations IVSd: 1.4 cm LA dim: 4.4 cm ESV(MOD-sp4):LVOT diam: 1.9 cm LVIDd: 3.6 cm EDV(MOD-sp4): 20.2 ml LVPWd: 1.4 cm 62.0 ml LVIDs: 2.1 cm  _______________________________________________________________________ SV(MOD-sp4): EF A4C: 67.4 % LA ESV (BP): LA ESV Index (A2C): 41.8 ml 55.2 ml 30.6 ml/m2 SI(MOD-sp4): 20.3 ml/m2 _______________________________________________________________________ LA ESV Index (A4C):LA ESV Index (BP): SV A4C: 23.2 ml/m2 26.8 ml/m2 41.8 ml  Doppler Measurements & Calculations MV E max vel: MV V2 max: MV dec time: SV(LVOT): 157.0 cm/sec 220.0 cm/sec 0.55 sec 73.3 ml MV A max vel: MV max PG: 19.4 mmHg Ao V2 max: 224.0 cm/sec MV V2 mean: 246.2 cm/sec MV E/A: 0.70 146.0 cm/sec Ao max PG: Med Peak E' Vel: MV mean PG: 9.7 mmHg 24.8 mmHg 9.2 cm/sec MV V2 VTI: 42.5 cm Ao V2 mean: Lat Peak E' Vel: MVA(VTI): 1.7 cm2 170.1 cm/sec 4.0 cm/sec Ao mean PG: E/Lat E`: 39.0 13.5 mmHg E/Med E`: 17.0 Ao V2 VTI: 55.6 cm AVA (VTI): 1.3 cm2 ______________________________________________________________________ LV V1 VTI: AS Dimensionless AVAi(VTI) SV index(LVOT): 25.6 cm Index (VTI): 0.46 cm^2/m^2: 35.5 ml/m2 0.64 cm2 :  Cardiac catheterization 04/06/2020:  DIAGNOSTIC FINDINGS:   1. Severe aortic stenosis, mean gradient 47 mmHg. Aortic valve area 0.59  cm.  2. Normal cardiac output, 4.8 L/min (cardiac index 2.3).  3. Moderate pulmonary  hypertension, 54/20 6 mmHg.  4. Elevated right atrial and left atrial filling pressures, consistent  with intravascular salt/volume overload.  5. Obstructive coronary artery disease involving the mid RCA and first  diagonal. Borderline mid LAD disease.   COMPLICATIONS: None  RECOMMENDATIONS:   Continue diuretic therapy, given the patient's significant salt/volume  overload.   A low-salt diet, antihypertensive therapy, and regular exercise are  strongly recommended, as these are the hallmarks of management of chronic  heart failure with preserved ejection fraction.   Given that the patient has no angina, revascularization of her coronary  arteries is not necessary at this time. We will manage her coronary  artery disease medically for now.   I will have a clinic appointment with the patient and family members  within the next few weeks to discuss management of her severe aortic  stenosis. Specifically, we will discuss whether she wishes to proceed  with a transcatheter aortic valve replacement versus a palliative care  approach.    Laboratory Data:  Chemistry Recent Labs  Lab 09/29/20 1820 09/30/20 0501 09/30/20 0712 09/30/20 0848  NA 132* 136 135 136  K 5.0 4.3 4.1 4.1  CL 96* 98  --   --   CO2 23 25  --   --   GLUCOSE 214* 206*  --   --   BUN 25* 27*  --   --   CREATININE 1.24* 1.69*  --   --   CALCIUM 8.3* 8.6*  --   --   GFRNONAA 43* 30*  --   --   ANIONGAP 13 13  --   --     Total Protein  Date Value Ref Range Status  09/30/2020 6.1 (L) 6.5 - 8.1 g/dL Final   Albumin  Date Value Ref Range Status  09/30/2020 2.9 (L) 3.5 - 5.0 g/dL Final   AST  Date Value Ref Range Status  09/30/2020 19 15 - 41 U/L Final   ALT  Date Value Ref Range Status  09/30/2020 20 0 - 44 U/L Final   Alkaline Phosphatase  Date Value Ref Range Status  09/30/2020 94 38 - 126 U/L Final   Total Bilirubin  Date Value Ref Range Status  09/30/2020 1.6 (H) 0.3 - 1.2 mg/dL  Final   Hematology Recent Labs  Lab 09/29/20 1820 09/30/20 0501 09/30/20 0712 09/30/20 0848  WBC 17.3* 18.6*  --   --   RBC 4.48 4.29  --   --   HGB 13.9 13.6 14.3 12.9  HCT 42.9 42.2 42.0 38.0  MCV 95.8 98.4  --   --   MCH 31.0 31.7  --   --   MCHC 32.4 32.2  --   --   RDW 13.5 13.6  --   --   PLT 200 201  --   --    Cardiac EnzymesNo results for input(s): TROPONINI in the last 168 hours. No results for input(s): TROPIPOC in the last 168 hours.  BNP Recent Labs  Lab 09/30/20 0453  BNP 384.7*    DDimer No results for input(s): DDIMER in the last 168 hours. TSH: No results found for: TSH Lipids:No results found for: CHOL, HDL, LDLCALC, LDLDIRECT, TRIG, CHOLHDL HgbA1c: Lab Results  Component Value Date   HGBA1C 7.0 (H) 09/30/2020    Radiology/Studies:  DG Chest Portable 1 View  Result Date: 09/30/2020 CLINICAL DATA:  86 year old female with weakness, shortness of breath, nausea vomiting and diarrhea. Decreased p.o. EXAM: PORTABLE CHEST 1 VIEW COMPARISON:  Portable chest 09/30/2020 and earlier. FINDINGS: Portable AP semi upright view at 0638 hours. Rotated to the right today. Chronic elevation of the left hemidiaphragm and cardiomegaly appear not significantly changed. No pneumothorax or pulmonary edema. No definite pleural effusion. Sequelae of  TAVR. Patchy new right lower lung opacity versus artifact on this rotated image. No acute osseous abnormality identified. Visible bowel-gas pattern within normal limits. IMPRESSION: 1. Rotated to the right today with questionable new patchy right lung base opacity. Consider bronchopneumonia or aspiration. PA and lateral views of the chest would be helpful when feasible. 2. Otherwise stable chest with cardiomegaly, prior TAVR, chronic left lung base hypo ventilation. Electronically Signed   By: Odessa Fleming M.D.   On: 09/30/2020 06:51   DG CHEST PORT 1 VIEW  Result Date: 09/30/2020 CLINICAL DATA:  Weakness, nausea, vomiting EXAM: PORTABLE  CHEST 1 VIEW COMPARISON:  09/29/2020 FINDINGS: Small left pleural effusion is present with associated left basilar atelectasis. Lungs are otherwise clear. No pneumothorax. No pleural effusion on the right. Mild to moderate cardiomegaly is unchanged. Transcatheter aortic valve replacement has been performed. The pulmonary vascularity is normal. IMPRESSION: No active disease. Stable cardiomegaly and small left pleural effusion. Electronically Signed   By: Helyn Numbers MD   On: 09/30/2020 05:16   DG Chest Portable 1 View  Result Date: 09/29/2020 CLINICAL DATA:  Weakness, hypoxemia, nausea, vomiting and diarrhea the EXAM: PORTABLE CHEST 1 VIEW COMPARISON:  CT and radiograph 04/01/2020 FINDINGS: Chronic elevation left hemidiaphragm. Some hazy opacities present in the lung bases with a mid to lower lung gradient. Slightly congested pulmonary vascularity. No pneumothorax. Left basilar pleural thickening is similar to prior likely reflects chronic effusion. Cardiomegaly with transcatheter aortic valve replacement in stable position. The aorta is calcified. The remaining cardiomediastinal contours are unremarkable. Telemetry leads overlie the chest. The osseous structures appear diffusely demineralized which may limit detection of small or nondisplaced fractures. Degenerative changes are present in the imaged spine and shoulders. No other acute osseous or soft tissue abnormality. IMPRESSION: 1. Chronic elevation left hemidiaphragm 2. Left basilar pleural thickening, likely chronic effusion. 3. Pulmonary vascular congestion with basilar opacity likely reflecting atelectasis and/or mild edema. 4. Stable cardiomegaly.  Prior TAVR in stable position. Electronically Signed   By: Kreg Shropshire M.D.   On: 09/29/2020 20:03   CT RENAL STONE STUDY  Result Date: 09/30/2020 CLINICAL DATA:  Weakness, nausea, vomiting, diarrhea EXAM: CT ABDOMEN AND PELVIS WITHOUT CONTRAST TECHNIQUE: Multidetector CT imaging of the abdomen and  pelvis was performed following the standard protocol without IV contrast. COMPARISON:  None. FINDINGS: Lower chest: There is elevation of the left hemidiaphragm. The visualized right lung base is unremarkable Hepatobiliary: The visualized liver is unremarkable. Cholelithiasis noted. No pericholecystic inflammatory change. No intra or extrahepatic biliary ductal dilation. Pancreas: Unremarkable Spleen: The visualized spleen is unremarkable. Adrenals/Urinary Tract: Adrenal glands are unremarkable. Kidneys are normal, without renal calculi, focal lesion, or hydronephrosis. Bladder is unremarkable. Stomach/Bowel: Evaluation of the bowel is limited by motion artifact. The stomach, small bowel, and large bowel, however, Vascular/Lymphatic: Extensive aortoiliac atherosclerotic calcification. No aortic aneurysm. Circumaortic left renal vein. No pathologic adenopathy within the abdomen and pelvis. Reproductive: Uterus and bilateral adnexa are unremarkable. Other: No abdominal wall hernia.  Rectum unremarkable. Musculoskeletal: Bilateral hip ORIF has been performed with dynamic hip screws noted. Degenerative changes are seen within the lumbar spine. No acute bone abnormality. IMPRESSION: Limited examination without evidence of acute intra-abdominal pathology. No definite radiographic explanation for the patient's reported symptoms. Cholelithiasis. Distal colonic diverticulosis Aortic Atherosclerosis (ICD10-I70.0). Electronically Signed   By: Helyn Numbers MD   On: 09/30/2020 05:37   Assessment and Plan:   1.  Elevated troponin with history of borderline obstructive CAD per pre-TAVR cath 03/2020: -Patient  presented 09/30/2020 with nausea, vomiting and worsening fatigue for the last several days. In the ED, she was found to be febrile with a temp at 103 with initial CXR showing left pleural effusion with left basilar atelectasis and moderate cardiomegaly. Lab work revealed leukocytosis at 17.3 with a creatinine at 1.69  with a baseline which appears to be in the 0.8-0.9 range. -BNP was found to be moderately elevated at 384.   -High-sensitivity troponin at 54>>> 201>>>720 with no complaints of chest pain.   -Pre-TAVR cardiac catheterization performed 04/06/2020 showed mid LAD lesion at 50%, first diagonal lesion at 70% and a mid RCA lesion at 80% per cath report>>obstructive CAD involving the mid RCA and first diagonal with borderline mid LAD disease. Also showed severe aortic stenosis with a mean gradient of 47 mmHg and aortic valve area of 0.59 cm. Given no history of angina no revascularization was pursued with plans to manage medically -Given acute infection with sepsis UTI with LLL PNA, acute hypoxic/hypercarbic respiratory failure, and AKI, she is not currently a candidate for further ischemic evaluation with LHC. Once, stable from acute illness, will need to consider risks versus benefits of re-cath and possible PCI intervention if indicated.   2. Acute hypoxic respiratory failure with PNS>suspected aspiration versus CAP: -Pt found to be hypoxic on presentation>>>ABG with CO2 at 59. She was placed on Bipap and given IV Lasix with improvement. She has now been transitioned back to [facility] -Management per PCCM   3. Sepsis secondary to UTI and LLL PNA: -Now hypotensive despite IVF hydration>>placed on pressors with improvement  -Lactic acid, 2.0>>>1.8>>clearing -Continue antibiotics per PCCM -Temp max>>103F  4. AKI: -Creatinine, 1.69 today >>up from 1.24 yesterday  -Renal CT negative for renal stone  -follow closely with daily BMET     5.  Paroxysmal atrial fibrillation: -Maintaining NSR on chronic anticoagulation.  Does have a history of GI bleed in 2017 with no identifiable cause.  Lab work per chart review has been stable -On PTA diltiazem 120, Toprol 100>>holding due to hypotension  -Continue Eliquis 5mg    6. 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 She developed  DOE with minimal exertion last year and was referred to Dr. 06/05/2020 for TAVR.  -Most recent echocardiogram 08/15/2020 performed at follow up showed 64mm Sapien bioprosthetic valve in the aortic position with a mean pressure gradient of gradient of 15 mmHg with no aortic regurgitation and mild aortic stenosis.  7.  Moderate mitral stenosis: -Last echocardiogram 07/2020 with moderate mitral annular calcification with rheumatic mitral stenosis, moderate with a transmitral pressure gradient of 9 mmHg.  8. Hypotension: -Secondary to sepsis>>not responding to IVF  -Currently more stable with Levophed  -Management per PCCM  9.  Carotid artery disease with hx of TIA: -Known 70% LICA>>>followed by vascular surgery  -Intolerant to statins, no ASA due to Eliquis   10.  HLD: -Continue Zetia -Statin intolerant  11. DM2: -SSI while inpatient  -On PTA glipizide -May benefit from SGLT2   For questions or updates, please contact [facility] Please consult [email or web address] for contact info under Cardiology/STEMI.   08/2020 NP-[facility] Pager: [phone] 09/30/2020 12:34 PM  I have personally seen and examined this patient. I agree with the assessment and plan as outlined above. She is known to have CAD by cath last year before her TAVR. She is followed in [facility] by cardiology. TAVR was done at [facility] in December 2021. She is now admitted  with hypotension, acute respiratory failure and fevers. She is felt to be septic with evidence of LLL infiltrate and evidence of UTI. Troponin is elevated. EKG with sinus with LVH-personally reviewed by me. No chest pain.  On exam, [id number], there are no loud murmurs. Lungs with bibasilar rales. Ext: no LE edema Plan: She has known CAD and now elevated troponin in setting of pneumonia, sepsis, hypoxemia. She has no chest pain. No ischemic changes on EKG. Echo with normal LV systolic function.  Her elevated troponin is likely demand  ischemia in setting of acute infectious illness/sepsis.  We will follow along. No plans for ischemic workup at this time.   Verne Carrow 09/30/2020 3:12 PM

## 2020-09-30 NOTE — ED Notes (Signed)
Dr. Toniann Fail notified of pt's SOB, tachypnea, and expiratory wheezing. Advised to stop fluids. New orders received for stat chest x-ray and lactic acid.

## 2020-10-01 ENCOUNTER — Inpatient Hospital Stay (HOSPITAL_COMMUNITY): Payer: Medicare Other

## 2020-10-01 DIAGNOSIS — I480 Paroxysmal atrial fibrillation: Secondary | ICD-10-CM

## 2020-10-01 DIAGNOSIS — A419 Sepsis, unspecified organism: Secondary | ICD-10-CM | POA: Diagnosis not present

## 2020-10-01 DIAGNOSIS — J9601 Acute respiratory failure with hypoxia: Secondary | ICD-10-CM

## 2020-10-01 DIAGNOSIS — N179 Acute kidney failure, unspecified: Secondary | ICD-10-CM | POA: Diagnosis not present

## 2020-10-01 DIAGNOSIS — J9602 Acute respiratory failure with hypercapnia: Secondary | ICD-10-CM

## 2020-10-01 DIAGNOSIS — R6510 Systemic inflammatory response syndrome (SIRS) of non-infectious origin without acute organ dysfunction: Secondary | ICD-10-CM | POA: Diagnosis not present

## 2020-10-01 LAB — CBC
HCT: 35.9 % — ABNORMAL LOW (ref 36.0–46.0)
Hemoglobin: 11.5 g/dL — ABNORMAL LOW (ref 12.0–15.0)
MCH: 31 pg (ref 26.0–34.0)
MCHC: 32 g/dL (ref 30.0–36.0)
MCV: 96.8 fL (ref 80.0–100.0)
Platelets: 163 10*3/uL (ref 150–400)
RBC: 3.71 MIL/uL — ABNORMAL LOW (ref 3.87–5.11)
RDW: 13.5 % (ref 11.5–15.5)
WBC: 19.9 10*3/uL — ABNORMAL HIGH (ref 4.0–10.5)
nRBC: 0 % (ref 0.0–0.2)

## 2020-10-01 LAB — GLUCOSE, CAPILLARY
Glucose-Capillary: 179 mg/dL — ABNORMAL HIGH (ref 70–99)
Glucose-Capillary: 158 mg/dL — ABNORMAL HIGH (ref 70–99)
Glucose-Capillary: 217 mg/dL — ABNORMAL HIGH (ref 70–99)
Glucose-Capillary: 212 mg/dL — ABNORMAL HIGH (ref 70–99)
Glucose-Capillary: 230 mg/dL — ABNORMAL HIGH (ref 70–99)
Glucose-Capillary: 221 mg/dL — ABNORMAL HIGH (ref 70–99)

## 2020-10-01 LAB — BASIC METABOLIC PANEL
Anion gap: 8 (ref 5–15)
BUN: 37 mg/dL — ABNORMAL HIGH (ref 8–23)
CO2: 25 mmol/L (ref 22–32)
Calcium: 8.1 mg/dL — ABNORMAL LOW (ref 8.9–10.3)
Chloride: 105 mmol/L (ref 98–111)
Creatinine, Ser: 1.76 mg/dL — ABNORMAL HIGH (ref 0.44–1.00)
GFR, Estimated: 28 mL/min — ABNORMAL LOW (ref >60)
Glucose, Bld: 198 mg/dL — ABNORMAL HIGH (ref 70–99)
Potassium: 4.3 mmol/L (ref 3.5–5.1)
Sodium: 138 mmol/L (ref 135–145)

## 2020-10-01 LAB — TROPONIN I (HIGH SENSITIVITY): Troponin I (High Sensitivity): 610 ng/L (ref <18)

## 2020-10-01 MED ORDER — BLISTEX MEDICATED EX OINT
TOPICAL_OINTMENT | CUTANEOUS | Status: DC | PRN
  Filled 2020-10-01: qty 6.3

## 2020-10-01 MED ORDER — METOPROLOL TARTRATE 25 MG PO TABS
25.0000 mg | ORAL_TABLET | Freq: Two times a day (BID) | ORAL | Status: DC
  Administered 2020-10-01 (×2): 25 mg via ORAL
  Filled 2020-10-01 (×2): qty 1

## 2020-10-01 MED ORDER — MELATONIN 3 MG PO TABS
3.0000 mg | ORAL_TABLET | Freq: Every evening | ORAL | Status: DC | PRN
  Administered 2020-10-02: 3 mg via ORAL
  Filled 2020-10-01: qty 1

## 2020-10-01 MED ORDER — LIP MEDEX EX OINT
TOPICAL_OINTMENT | CUTANEOUS | Status: DC | PRN
  Filled 2020-10-01: qty 7

## 2020-10-01 MED ORDER — LACTATED RINGERS IV BOLUS
1000.0000 mL | Freq: Once | INTRAVENOUS | Status: AC
  Administered 2020-10-01: 1000 mL via INTRAVENOUS

## 2020-10-01 MED ORDER — APIXABAN 2.5 MG PO TABS
2.5000 mg | ORAL_TABLET | Freq: Two times a day (BID) | ORAL | Status: DC
  Administered 2020-10-01 – 2020-10-03 (×4): 2.5 mg via ORAL
  Filled 2020-10-01 (×4): qty 1

## 2020-10-01 NOTE — Progress Notes (Signed)
Modified Barium Swallow Progress Note  Patient Details  Name: Brianna Lowe MRN: [id number] Date of Birth: [birth year]  Today's Date: 10/01/2020  Modified Barium Swallow completed.  Full report located under Chart Review in the Imaging Section.  Brief recommendations include the following:  Clinical Impression  Pt presents with functional oropharyngeal swallowing.  There was transient penetration of thin liquid during pill simulation only.  There was brief oral stasis of tablet.  Pt used a head jerk to assist in transit of tablet.  This resulted in premature spillage of thin liquid wash into the laryngeal vestibule, which was fully cleared during swallow.  Pt denies that she has difficulty taking medications typically, and RN reported no difficulty with today's medications.  Should pt have difficulty swallowing pills, consider giving 1 at a time, or whole with puree.    Recommend regular texture diet with thin liquids.  Pt has no further ST needs at this time; SLP will sign off.   Swallow Evaluation Recommendations       SLP Diet Recommendations: Regular solids;Thin liquid   Liquid Administration via: Cup;Straw   Medication Administration: Whole meds with liquid (One at a time if needed, or with puree)   Supervision: Patient able to self feed           Oral Care Recommendations: Oral care BID        [name], MA, CCC-SLP Acute Rehabilitation Services Office: [phone]; Pager [phone]): [phone] 10/01/2020,11:58 AM

## 2020-10-01 NOTE — Progress Notes (Signed)
Critical care notified, patient in Afib rate 140-150s.  Patient denies chest pain or discomfort.  Vitals taken, HR in 120s.  Also advised patient had been in NSR earlier this shift and this was a rhythm change, patient has history of Afib.  Has scheduled Metoprolol, see MAR.

## 2020-10-01 NOTE — Progress Notes (Signed)
Progress Note  Patient Name: Brianna Lowe Date of Encounter: 10/01/2020   [facility] Cardiologist: Dr Donnamarie Poag (HP)  Dr Reuel Boom [facility])  Subjective   Pt denies CP   "I never had CP"   Breathing is OK   WOnders how she got so sick  Did say urine smelled bad recently  Inpatient Medications    Scheduled Meds: . apixaban  5 mg Oral BID  . Chlorhexidine Gluconate Cloth  6 each Topical Daily  . ezetimibe  10 mg Oral Daily  . insulin aspart  0-9 Units Subcutaneous Q4H  . insulin detemir  10 Units Subcutaneous BID  . pantoprazole (PROTONIX) IV  40 mg Intravenous Q24H   Continuous Infusions: . sodium chloride    . cefTRIAXone (ROCEPHIN)  IV Stopped (09/30/20 2021)  . lactated ringers    . norepinephrine (LEVOPHED) Adult infusion 2 mcg/min (10/01/20 0600)   PRN Meds: acetaminophen **OR** acetaminophen, ipratropium-albuterol, lip balm   Vital Signs    Vitals:   10/01/20 0615 10/01/20 0645 10/01/20 0732 10/01/20 0739  BP: (!) 130/52 (!) 103/50  (!) 118/93  Pulse: 95 91  97  Resp: 16 16  19   Temp:   98.2 F (36.8 C)   TempSrc:   Axillary   SpO2: 95% 96%  97%  Weight:      Height:        Intake/Output Summary (Last 24 hours) at 10/01/2020 0942 Last data filed at 10/01/2020 0600 Gross per 24 hour  Intake 686.42 ml  Output 700 ml  Net -13.58 ml   Last 3 Weights 10/01/2020 09/29/2020  Weight (lbs) 149 lb 14.6 oz 150 lb  Weight (kg) 68 kg 68.04 kg      Telemetry    SR - Personally Reviewed  ECG    -None today  Personally Reviewed  Physical Exam   GEN: Obese 86 yo in acute distress.   Neck: Neck is full  NO bruits   Cardiac: RRR, no murmurs Respiratory: Clear to auscultation bilaterally. GI: Soft, nontender, non-distended  MS: No edema; No deformity. Neuro:  Nonfocal  Psych: Normal affect   Labs    High Sensitivity Troponin:   Recent Labs  Lab 09/29/20 1820 09/29/20 2011 09/30/20 0501 09/30/20 0747 09/30/20 1051  TROPONINIHS 14 19* 54* 201* 720*       Chemistry Recent Labs  Lab 09/29/20 1820 09/30/20 0501 09/30/20 0712 09/30/20 0848 10/01/20 0100  NA 132* 136 135 136 138  K 5.0 4.3 4.1 4.1 4.3  CL 96* 98  --   --  105  CO2 23 25  --   --  25  GLUCOSE 214* 206*  --   --  198*  BUN 25* 27*  --   --  37*  CREATININE 1.24* 1.69*  --   --  1.76*  CALCIUM 8.3* 8.6*  --   --  8.1*  PROT 5.9* 6.1*  --   --   --   ALBUMIN 2.9* 2.9*  --   --   --   AST 25 19  --   --   --   ALT 15 20  --   --   --   ALKPHOS 99 94  --   --   --   BILITOT 3.4* 1.6*  --   --   --   GFRNONAA 43* 30*  --   --  28*  ANIONGAP 13 13  --   --  8     Hematology  Recent Labs  Lab 09/29/20 1820 09/30/20 0501 09/30/20 0712 09/30/20 0848 10/01/20 0100  WBC 17.3* 18.6*  --   --  19.9*  RBC 4.48 4.29  --   --  3.71*  HGB 13.9 13.6 14.3 12.9 11.5*  HCT 42.9 42.2 42.0 38.0 35.9*  MCV 95.8 98.4  --   --  96.8  MCH 31.0 31.7  --   --  31.0  MCHC 32.4 32.2  --   --  32.0  RDW 13.5 13.6  --   --  13.5  PLT 200 201  --   --  163    BNP Recent Labs  Lab 09/30/20 0453  BNP 384.7*     DDimer No results for input(s): DDIMER in the last 168 hours.   Radiology    DG Chest Portable 1 View  Result Date: 09/30/2020 CLINICAL DATA:  86 year old female with weakness, shortness of breath, nausea vomiting and diarrhea. Decreased p.o. EXAM: PORTABLE CHEST 1 VIEW COMPARISON:  Portable chest 09/30/2020 and earlier. FINDINGS: Portable AP semi upright view at 0638 hours. Rotated to the right today. Chronic elevation of the left hemidiaphragm and cardiomegaly appear not significantly changed. No pneumothorax or pulmonary edema. No definite pleural effusion. Sequelae of TAVR. Patchy new right lower lung opacity versus artifact on this rotated image. No acute osseous abnormality identified. Visible bowel-gas pattern within normal limits. IMPRESSION: 1. Rotated to the right today with questionable new patchy right lung base opacity. Consider bronchopneumonia or aspiration.  PA and lateral views of the chest would be helpful when feasible. 2. Otherwise stable chest with cardiomegaly, prior TAVR, chronic left lung base hypo ventilation. Electronically Signed   By: Odessa Fleming M.D.   On: 09/30/2020 06:51   DG CHEST PORT 1 VIEW  Result Date: 09/30/2020 CLINICAL DATA:  Weakness, nausea, vomiting EXAM: PORTABLE CHEST 1 VIEW COMPARISON:  09/29/2020 FINDINGS: Small left pleural effusion is present with associated left basilar atelectasis. Lungs are otherwise clear. No pneumothorax. No pleural effusion on the right. Mild to moderate cardiomegaly is unchanged. Transcatheter aortic valve replacement has been performed. The pulmonary vascularity is normal. IMPRESSION: No active disease. Stable cardiomegaly and small left pleural effusion. Electronically Signed   By: Helyn Numbers MD   On: 09/30/2020 05:16   DG Chest Portable 1 View  Result Date: 09/29/2020 CLINICAL DATA:  Weakness, hypoxemia, nausea, vomiting and diarrhea the EXAM: PORTABLE CHEST 1 VIEW COMPARISON:  CT and radiograph 04/01/2020 FINDINGS: Chronic elevation left hemidiaphragm. Some hazy opacities present in the lung bases with a mid to lower lung gradient. Slightly congested pulmonary vascularity. No pneumothorax. Left basilar pleural thickening is similar to prior likely reflects chronic effusion. Cardiomegaly with transcatheter aortic valve replacement in stable position. The aorta is calcified. The remaining cardiomediastinal contours are unremarkable. Telemetry leads overlie the chest. The osseous structures appear diffusely demineralized which may limit detection of small or nondisplaced fractures. Degenerative changes are present in the imaged spine and shoulders. No other acute osseous or soft tissue abnormality. IMPRESSION: 1. Chronic elevation left hemidiaphragm 2. Left basilar pleural thickening, likely chronic effusion. 3. Pulmonary vascular congestion with basilar opacity likely reflecting atelectasis and/or mild  edema. 4. Stable cardiomegaly.  Prior TAVR in stable position. Electronically Signed   By: Kreg Shropshire M.D.   On: 09/29/2020 20:03   ECHOCARDIOGRAM COMPLETE  Result Date: 09/30/2020    ECHOCARDIOGRAM REPORT   Patient Name:   Brianna Lowe Date of Exam: 09/30/2020 Medical Rec #:  [id number]  Height:       64.0 in Accession #:    [id number]        Weight:       150.0 lb Date of Birth:  [birth year]         BSA:          1.731 m Patient Age:    85 years          BP:           110/56 mmHg Patient Gender: F                 HR:           98 bpm. Exam Location:  Inpatient Procedure: 2D Echo, Cardiac Doppler and Color Doppler Indications:    CHF  History:        Patient has no prior history of Echocardiogram examinations.                 Pulmonary HTN, Aortic Valve Disease and Mitral Valve Disease,                 Arrythmias:Atrial Fibrillation; Risk Factors:Former Smoker. S/P                 bioprosthetic aortic valve 05/2020.                 Aortic Valve: 23 mm Sapien prosthetic, stented (TAVR) valve is                 present in the aortic position. Procedure Date: 06/03/20.  Sonographer:    Marquisa Salih LudwigArthur Guy RDCS (AE) Referring Phys: [id number] Hava Massingale B SIMPSON IMPRESSIONS  1. Left ventricular ejection fraction, by estimation, is 60 to 65%. The left ventricle has normal function. The left ventricle has no regional wall motion abnormalities. There is moderate concentric left ventricular hypertrophy. Left ventricular diastolic function could not be evaluated. Elevated left ventricular end-diastolic pressure.  2. Right ventricular systolic function was not well visualized. The right ventricular size is not well visualized.  3. Left atrial size was severely dilated.  4. Right atrial size was mildly dilated.  5. The mitral valve is rheumatic. Trivial mitral valve regurgitation. Moderate mitral stenosis. The mean mitral valve gradient is 6.0 mmHg. Moderate to severe mitral annular calcification.  6. The aortic valve has been  repaired/replaced. Aortic valve regurgitation not well visualized. There is a 23 mm Sapien prosthetic (TAVR) valve present in the aortic position. Procedure Date: 06/03/20. Aortic valve mean gradient measures 13.0 mmHg.  7. The inferior vena cava is dilated in size with <50% respiratory variability, suggesting right atrial pressure of 15 mmHg. Comparison(s): Prior images unable to be directly viewed, comparison made by report only. FINDINGS  Left Ventricle: Left ventricular ejection fraction, by estimation, is 60 to 65%. The left ventricle has normal function. The left ventricle has no regional wall motion abnormalities. The left ventricular internal cavity size was normal in size. There is  moderate concentric left ventricular hypertrophy. Left ventricular diastolic function could not be evaluated due to mitral annular calcification (moderate or greater). Left ventricular diastolic function could not be evaluated. Elevated left ventricular  end-diastolic pressure. Right Ventricle: The right ventricular size is not well visualized. Right vetricular wall thickness was not well visualized. Right ventricular systolic function was not well visualized. Left Atrium: Left atrial size was severely dilated. Right Atrium: Right atrial size was mildly dilated. Pericardium: There is no evidence of pericardial effusion. Mitral Valve: The mitral valve is rheumatic.  Moderate to severe mitral annular calcification. Trivial mitral valve regurgitation. Moderate mitral valve stenosis. MV peak gradient, 12.2 mmHg. The mean mitral valve gradient is 6.0 mmHg. Tricuspid Valve: The tricuspid valve is grossly normal. Tricuspid valve regurgitation is trivial. Aortic Valve: The aortic valve has been repaired/replaced. Aortic valve regurgitation not well visualized. Aortic valve mean gradient measures 13.0 mmHg. Aortic valve peak gradient measures 24.7 mmHg. Aortic valve area, by VTI measures 2.15 cm. There is  a 23 mm Sapien prosthetic,  stented (TAVR) valve present in the aortic position. Procedure Date: 06/03/20. Pulmonic Valve: The pulmonic valve was not well visualized. Pulmonic valve regurgitation is not visualized. Aorta: The aortic root was not well visualized, the ascending aorta was not well visualized and the aortic arch was not well visualized. Venous: The inferior vena cava is dilated in size with less than 50% respiratory variability, suggesting right atrial pressure of 15 mmHg. IAS/Shunts: The atrial septum is grossly normal.  LEFT VENTRICLE PLAX 2D LVIDd:         4.80 cm  Diastology LVIDs:         3.10 cm  LV e' lateral:   4.46 cm/s LV PW:         1.30 cm  LV E/e' lateral: 37.4 LV IVS:        1.40 cm LVOT diam:     2.40 cm LV SV:         86 LV SV Index:   49 LVOT Area:     4.52 cm  RIGHT VENTRICLE            IVC RV Basal diam:  3.40 cm    IVC diam: 2.50 cm RV S prime:     8.59 cm/s TAPSE (M-mode): 2.0 cm LEFT ATRIUM           Index       RIGHT ATRIUM           Index LA diam:      3.60 cm 2.08 cm/m  RA Area:     13.90 cm LA Vol (A2C): 57.2 ml 33.04 ml/m RA Volume:   35.00 ml  20.22 ml/m LA Vol (A4C): 78.2 ml 45.17 ml/m  AORTIC VALVE AV Area (Vmax):    1.70 cm AV Area (Vmean):   1.76 cm AV Area (VTI):     2.15 cm AV Vmax:           248.33 cm/s AV Vmean:          166.667 cm/s AV VTI:            0.398 m AV Peak Grad:      24.7 mmHg AV Mean Grad:      13.0 mmHg LVOT Vmax:         93.10 cm/s LVOT Vmean:        64.800 cm/s LVOT VTI:          0.189 m LVOT/AV VTI ratio: 0.48  AORTA Ao Root diam: 2.60 cm Ao Asc diam:  2.80 cm MITRAL VALVE MV Area (PHT): 3.89 cm     SHUNTS MV Area VTI:   2.13 cm     Systemic VTI:  0.19 m MV Peak grad:  12.2 mmHg    Systemic Diam: 2.40 cm MV Mean grad:  6.0 mmHg MV Vmax:       1.75 m/s MV Vmean:      112.0 cm/s MV Decel Time: 195 msec MV E velocity: 167.00 cm/s MV A velocity: 160.00 cm/s  MV E/A ratio:  1.04 Jodelle Red MD Electronically signed by Jodelle Red MD Signature Date/Time:  09/30/2020/5:41:36 PM    Final    CT RENAL STONE STUDY  Result Date: 09/30/2020 CLINICAL DATA:  Weakness, nausea, vomiting, diarrhea EXAM: CT ABDOMEN AND PELVIS WITHOUT CONTRAST TECHNIQUE: Multidetector CT imaging of the abdomen and pelvis was performed following the standard protocol without IV contrast. COMPARISON:  None. FINDINGS: Lower chest: There is elevation of the left hemidiaphragm. The visualized right lung base is unremarkable Hepatobiliary: The visualized liver is unremarkable. Cholelithiasis noted. No pericholecystic inflammatory change. No intra or extrahepatic biliary ductal dilation. Pancreas: Unremarkable Spleen: The visualized spleen is unremarkable. Adrenals/Urinary Tract: Adrenal glands are unremarkable. Kidneys are normal, without renal calculi, focal lesion, or hydronephrosis. Bladder is unremarkable. Stomach/Bowel: Evaluation of the bowel is limited by motion artifact. The stomach, small bowel, and large bowel, however, Vascular/Lymphatic: Extensive aortoiliac atherosclerotic calcification. No aortic aneurysm. Circumaortic left renal vein. No pathologic adenopathy within the abdomen and pelvis. Reproductive: Uterus and bilateral adnexa are unremarkable. Other: No abdominal wall hernia.  Rectum unremarkable. Musculoskeletal: Bilateral hip ORIF has been performed with dynamic hip screws noted. Degenerative changes are seen within the lumbar spine. No acute bone abnormality. IMPRESSION: Limited examination without evidence of acute intra-abdominal pathology. No definite radiographic explanation for the patient's reported symptoms. Cholelithiasis. Distal colonic diverticulosis Aortic Atherosclerosis (ICD10-I70.0). Electronically Signed   By: Helyn Numbers MD   On: 09/30/2020 05:37    Cardiac Studies   Echo:   09/30/20  1. Left ventricular ejection fraction, by estimation, is 60 to 65%. The  left ventricle has normal function. The left ventricle has no regional  wall motion  abnormalities. There is moderate concentric left ventricular  hypertrophy. Left ventricular  diastolic function could not be evaluated. Elevated left ventricular  end-diastolic pressure.  2. Right ventricular systolic function was not well visualized. The right  ventricular size is not well visualized.  3. Left atrial size was severely dilated.  4. Right atrial size was mildly dilated.  5. The mitral valve is rheumatic. Trivial mitral valve regurgitation.  Moderate mitral stenosis. The mean mitral valve gradient is 6.0 mmHg.  Moderate to severe mitral annular calcification.  6. The aortic valve has been repaired/replaced. Aortic valve  regurgitation not well visualized. There is a 23 mm Sapien prosthetic  (TAVR) valve present in the aortic position. Procedure Date: 06/03/20.  Aortic valve mean gradient measures 13.0 mmHg.  7. The inferior vena cava is dilated in size with <50% respiratory  variability, suggesting right atrial pressure of 15 mmHg.   Echo  08/15/20  SUMMARY Left ventricular systolic function is normal. LV ejection fraction = 65-70%. No segmental wall motion abnormalities seen in the left ventricle The right ventricle is normal in size and function. The left atrial size is normal. 45mm Sapien bioprosthetic valve in the aortic position. Mean pressure gradient of 15 mmHg. There is no aortic regurgitation. There is mild aortic stenosis. There is moderate mitral stenosis. Mean transmitral pressure gradient of 9 mmHg. The heart rate for the mean mitral valve gradient is 94 BPM. There is no significant change in comparison with the last study.  Cath 04/06/20  DIAGNOSTIC FINDINGS:   1. Severe aortic stenosis, mean gradient 47 mmHg. Aortic valve area 0.59  cm.  2. Normal cardiac output, 4.8 L/min (cardiac index 2.3).  3. Moderate pulmonary hypertension, 54/20 6 mmHg.  4. Elevated right atrial and left atrial filling pressures, consistent  with  intravascular salt/volume overload.  5. Obstructive coronary artery disease involving the mid RCA and first  diagonal. Borderline mid LAD disease.   COMPLICATIONS: None   RECOMMENDATIONS:   Continue diuretic therapy, given the patient's significant salt/volume  overload.   A low-salt diet, antihypertensive therapy, and regular exercise are  strongly recommended, as these are the hallmarks of management of chronic  heart failure with preserved ejection fraction.   Given that the patient has no angina, revascularization of her coronary  arteries is not necessary at this time. We will manage her coronary  artery disease medically for now.   I will have a clinic appointment with the patient and family members  within the next few weeks to discuss management of her severe aortic  stenosis. Specifically, we will discuss whether she wishes to proceed  with a transcatheter aortic valve replacement versus a palliative care  approach.   Patient Profile      Brianna Lowe is a 86 y.o. female with a hx of aortic stenosis s/p TAVR 06/03/2020, mitral stenosis (rheumatic fever at 86yo), DM2, history of TIA, paroxysmal atrial fibrillation on chronic anticoagulation, borderline obstructive CAD with first diagonal lesion at 70% and a mid RCA lesion at 80% per cath 03/2020, HTN, HLD, carotid artery stenosis with a known 70% stenosis of the LICA, pulmonary hypertension and former tobacco use who is being seen today for the evaluation of elevated troponin at the request of Dr. Everardo All.  Assessment & Plan    1   CAD/ Elevated troponin    Pt with known CAD        Peak Trop 720   Most likely represents demand ischemia in setting of sepsis  Pt without angina   Echo with normal LVEF  Would repeat trop x 1  To confirm declinie Would recomm continued medical Rx   I would not recomm ischemic eval at this time unless she develops symptoms, clinical course changes    2  S/P TAVR  05/2020 [facility], Dr  Reuel Boom)  23 mm Sapien bioprosthesis     3  Mod Mitral stenosis  REmains mod on this echo    4.  Sepsis  Felt due to UTI and aspiration neumonia    4  Blood pressure  BP is improving   At home was on 100 Toprol XL; aldactone 25 bid; Dilt 120 daily  Furosemide 40 mg daily   REcomm: start low dose b blocker  Tartrate bid 25 mg   Follow BP and HR   Hold other meds    5  Hx PAF  Pt has been maintained  on Eliquis bid  As well as dilt and toprol   Continue eliquis  Add back b blocker    5   CVZ   70% LICA  Follows with vascular surgery  6  HL On Zetia  Intolerant to statins  Rx Zetoa     Will follow with Dr Donnamarie Poag to discuss/ review  ? Repatha  For questions or updates, please contact [facility] Please consult [email or web address] for contact info under        Signed, Dietrich Pates, MD  10/01/2020, 9:42 AM

## 2020-10-01 NOTE — Progress Notes (Signed)
NAME:  Brianna Lowe, MRN:  [id number], DOB:  [birth year], LOS: 1 ADMISSION DATE:  09/29/2020, CONSULTATION DATE:  09/30/2020 REFERRING MD:  Dr. Toniann Fail, CHIEF COMPLAINT:  Resp distress  History of Present Illness:  HPI obtained from medical chart review and limited from patient has she is on NIV.  86 year old female presenting from independent living with complaints of one day history of nausea, vomiting, weakness, and poor PO intake.  Appears to be recently seen for a pruritic rash on her upper chest (ongoing for 2 months) and recently treated for UTI with keflex on 3/4.    She was found on workup to be febrile 103, requiring 2L O2, initial CXR showing small left pleural effusion and left basilar atelectasis with moderate cardiomegaly.  Labs noted for WBC 17.3, glucose 206, BUN 27, sCr 1.69 (previously 0.82 07/2020), albumin 2.9, protein 6.1, BNP 384, troponin hs 54, lactic acid 2-> 1.8, neg COVID, urine showing large leukocytes with many bacteria and > 50 WBC, mod Hgb, 5 ketones, 100 protein.  Blood and urine cultures sent. She was empirically started on azithromycin, ceftriaxone, and vancomycin.  CT renal stone study without acute findings.  Treated with 1L NS in ER.  EKG showing ST and repeat with LBBB in which patient has hx of same.  Has denied SOB or chest pain. TRH to admit for sepsis.  However, she developed further respiratory distress with increased work of breathing and encephalopathic and was placed on BiPAP and given lasix 40mg .  Follow up CXR questions new patchy right lung opacity, concerning for aspiration.  ABG noted 7.275/ 59.8/ 95/ 27.   PCCM called for ICU evaluation.   Significant Hospital Events: Including procedures, antibiotic start and stop dates in addition to other pertinent events   . Initially admitted to [facility] for sepsis/ fever, UTI, possible aspiration PNA, but developed progressive resp distress on BiPAP, on vanc/ ctx/ azithro,  PCCM consulted-> ICU for close  monitoring   Interim History / Subjective:  Patient's blood culture grew E. coli which is pansensitive. Initially required low-dose vasopressors, now off.  X-ray chest is suggestive of right lower lobe pneumonia could be due to aspiration  Objective   Blood pressure (!) 118/93, pulse 97, temperature 98.2 F (36.8 C), temperature source Axillary, resp. rate 19, height 5\' 4"  (1.626 m), weight 68 kg, SpO2 97 %.        Intake/Output Summary (Last 24 hours) at 10/01/2020 1035 Last data filed at 10/01/2020 0600 Gross per 24 hour  Intake 686.42 ml  Output 700 ml  Net -13.58 ml   Filed Weights   09/29/20 1813 10/01/20 0439  Weight: 68 kg 68 kg   Examination: General:  Elderly obese female sitting upright in the bed  HEENT: Atraumatic, normocephalic, on high flow nasal cannula at 7 L, moist mucous membranes Neuro: Alert, awake, following commands, moving all 4 extremities spontaneously CV: Regular rate and rhythm, no murmur PULM: Crackles heard at the right lower lung zone, otherwise clear Extremities: warm/dry, no LE edema - does not appear volume overloaded  Skin: maculopapular rash to upper chest    Labs/imaging that I havepersonally reviewed  (right click and "Reselect all SmartList Selections" daily)  4/15 CT renal stone >> Limited examination without evidence of acute intra-abdominal pathology. No definite radiographic explanation for the patient's reported symptoms.  Cholelithiasis. Distal colonic diverticulosis.  Aortic Atherosclerosis   CBC, CMET, CXR, and micro data reviewed  Resolved Hospital Problem list    Assessment & Plan:  Acute hypoxic/ hypercarbic respiratory failure LLL PNA, suspected aspiration vs CAP  Patient is currently on nasal cannula oxygen, able to maintain O2 sat around 95% She does not look in distress Continue IV antibiotics with ceftriaxone Swallow evaluation is requested, patient will go for barium swallow study later today Keep her n.p.o. for  now  Sepsis secondary to UTI and LLL PNA E. coli bacteremia, probably urinary source Patient blood culture grew E. coli bacteremia, which is pansensitive MRSA screen is negative Continue IV ceftriaxone  AKI  Likely pre-renal given N/V, poor PO intake and sepsis We will give her 1 L of IV fluid Monitor intake and output and serum creatinine  HTN Hold antihypertensive for now   PAF Patient remained in sinus rhythm with controlled heart rate Continue Eliquis  CAD, AS s/p TAVR 05/2020, moderate MS Demand cardiac ischemia in the setting of sepsis Patient serum troponin trended up to 720 Appreciate cardiology input continue statin Echocardiogram showed an normal EF with no wall motion abnormalities  DMT2, uncontrolled Monitor fingerstick, continue sliding scale insulin  Upper chest rash Closely monitor for signs of worsening  Best practice (right click and "Reselect all SmartList Selections" daily)  Diet:  NPO/barium swallow study later today Pain/Anxiety/Delirium protocol (if indicated): No VAP protocol (if indicated): Not indicated DVT prophylaxis: Systemic AC GI prophylaxis: PPI Glucose control:  SSI Yes Central venous access:  N/A Arterial line:  N/A Foley:  N/A Mobility: As tolerated PT consulted: N/A Last date of multidisciplinary goals of care discussion: patient confirmed full code.  States her son to be NOK, however brother, Kennith [place] listed in emergency contacts Code Status:  full code Disposition: Transfer to progressive care  Labs   CBC: Recent Labs  Lab 09/29/20 1820 09/30/20 0501 09/30/20 0712 09/30/20 0848 10/01/20 0100  WBC 17.3* 18.6*  --   --  19.9*  NEUTROABS 15.7*  --   --   --   --   HGB 13.9 13.6 14.3 12.9 11.5*  HCT 42.9 42.2 42.0 38.0 35.9*  MCV 95.8 98.4  --   --  96.8  PLT 200 201  --   --  163    Basic Metabolic Panel: Recent Labs  Lab 09/29/20 1820 09/30/20 0501 09/30/20 0712 09/30/20 0848 09/30/20 1051 10/01/20 0100  NA  132* 136 135 136  --  138  K 5.0 4.3 4.1 4.1  --  4.3  CL 96* 98  --   --   --  105  CO2 23 25  --   --   --  25  GLUCOSE 214* 206*  --   --   --  198*  BUN 25* 27*  --   --   --  37*  CREATININE 1.24* 1.69*  --   --   --  1.76*  CALCIUM 8.3* 8.6*  --   --   --  8.1*  MG  --   --   --   --  1.7  --    GFR: Estimated Creatinine Clearance: 22.9 mL/min (A) (by C-G formula based on SCr of 1.76 mg/dL (H)). Recent Labs  Lab 09/29/20 1820 09/29/20 2023 09/30/20 0452 09/30/20 0501 10/01/20 0100  WBC 17.3*  --   --  18.6* 19.9*  LATICACIDVEN  --  2.0* 1.8  --   --     Liver Function Tests: Recent Labs  Lab 09/29/20 1820 09/30/20 0501  AST 25 19  ALT 15 20  ALKPHOS 99 94  BILITOT 3.4*  1.6*  PROT 5.9* 6.1*  ALBUMIN 2.9* 2.9*   Recent Labs  Lab 09/29/20 1820  LIPASE 26   No results for input(s): AMMONIA in the last 168 hours.  ABG    Component Value Date/Time   PHART 7.376 09/30/2020 1233   PCO2ART 41.1 09/30/2020 1233   PO2ART 138 (H) 09/30/2020 1233   HCO3 23.5 09/30/2020 1233   TCO2 29 09/30/2020 0848   ACIDBASEDEF 1.0 09/30/2020 1233   O2SAT 98.8 09/30/2020 1233     Coagulation Profile: Recent Labs  Lab 09/30/20 1051  INR 1.8*    Cardiac Enzymes: No results for input(s): CKTOTAL, CKMB, CKMBINDEX, TROPONINI in the last 168 hours.  HbA1C: Hgb A1c MFr Bld  Date/Time Value Ref Range Status  09/30/2020 08:33 AM 7.0 (H) 4.8 - 5.6 % Final    Comment:    (NOTE) Pre diabetes:          5.7%-6.4%  Diabetes:              >6.4%  Glycemic control for   <7.0% adults with diabetes     CBG: Recent Labs  Lab 09/30/20 1521 09/30/20 1945 09/30/20 2313 10/01/20 0333 10/01/20 0728  GLUCAP 235* 240* 217* 179* 158*     Cheri Fowler MD [facility] See Amion for pager If no response to pager, please call [phone] until 7pm After 7pm, Please call E-link [phone]

## 2020-10-01 NOTE — Evaluation (Signed)
Clinical/Bedside Swallow Evaluation Patient Details  Name: Brianna Lowe MRN: [id number] Date of Birth: [birth year]  Today's Date: 10/01/2020 Time: SLP Start Time (ACUTE ONLY): 0934 SLP Stop Time (ACUTE ONLY): 0951 SLP Time Calculation (min) (ACUTE ONLY): 17 min  Past Medical History:  Past Medical History:  Diagnosis Date  . Atrial fibrillation (HCC)   . AV block   . CAD (coronary artery disease)   . Congestive heart failure (CHF) (HCC)   . COPD (chronic obstructive pulmonary disease) (HCC)   . Diabetes (HCC)   . GERD (gastroesophageal reflux disease)   . Hypertension   . Left bundle branch block   . Localized edema   . TIA (transient ischemic attack)   . Vitamin D deficiency    Past Surgical History:  Past Surgical History:  Procedure Laterality Date  . heart valve    . REPLACEMENT TOTAL KNEE BILATERAL     HPI:  86 year old female presenting from independent living with complaints of one day history of nausea, vomiting, weakness, and poor PO intake.  Appears to be recently seen for a pruritic rash on her upper chest (ongoing for 2 months) and recently treated for UTI with keflex on 3/4. Admitted for sepsis to [facility].  She developed further respiratory distress with increased work of breathing and encephalopathic and was placed on BiPAP and given lasix 40mg .  Follow up CXR questions new patchy right lung opacity, concerning for aspiration.   Assessment / Plan / Recommendation Clinical Impression  Pt tolerated all consistencies trialed today; however a concern for prandial aspiration still exists given abnormal chest xray.  MD is concerned for ongoing aspiration, not just aspiration event related to N/V episodes.  Will proceed with MBSS to further assess pharyngeal swallow function prior to initiation of PO diet.   SLP Visit Diagnosis: Dysphagia, unspecified (R13.10)    Aspiration Risk  Mild aspiration risk    Diet Recommendation NPO   Medication Administration: Via  alternative means    Other  Recommendations Oral Care Recommendations: Oral care QID   Follow up Recommendations  (TBD)      Frequency and Duration  (TBD)          Prognosis Prognosis for Safe Diet Advancement: Good      Swallow Study   General Date of Onset: 09/29/20 HPI: 86 year old female presenting from independent living with complaints of one day history of nausea, vomiting, weakness, and poor PO intake.  Appears to be recently seen for a pruritic rash on her upper chest (ongoing for 2 months) and recently treated for UTI with keflex on 3/4. Admitted for sepsis to [facility].  She developed further respiratory distress with increased work of breathing and encephalopathic and was placed on BiPAP and given lasix 40mg .  Follow up CXR questions new patchy right lung opacity, concerning for aspiration. Type of Study: Bedside Swallow Evaluation Previous Swallow Assessment: None Diet Prior to this Study: NPO Temperature Spikes Noted: No Respiratory Status: Nasal cannula Behavior/Cognition: Alert;Cooperative;Pleasant mood Oral Cavity Assessment: Within Functional Limits Oral Care Completed by SLP: No Oral Cavity - Dentition: Adequate natural dentition Patient Positioning: Upright in bed Baseline Vocal Quality: Hoarse Volitional Cough: Strong Volitional Swallow: Able to elicit    Oral/Motor/Sensory Function Overall Oral Motor/Sensory Function: Within functional limits Facial ROM: Within Functional Limits Facial Symmetry: Within Functional Limits Lingual ROM: Within Functional Limits Lingual Symmetry: Within Functional Limits Lingual Strength: Within Functional Limits Velum: Within Functional Limits Mandible: Within Functional Limits   [facility]  chips: Not tested   Thin Liquid Thin Liquid: Within functional limits Presentation: Cup;Straw    Nectar Thick Nectar Thick Liquid: Not tested   Honey Thick Honey Thick Liquid: Not tested   Puree Puree: Within functional  limits Presentation: Spoon   Solid     Solid: Within functional limits Presentation:  (SLP fed)      [name] , MA, CCC-SLP Acute Rehabilitation Services Office: [phone]; Pager [phone]): [phone] 10/01/2020,10:02 AM

## 2020-10-01 NOTE — Progress Notes (Signed)
eLink Physician-Brief Progress Note Patient Name: Brianna Lowe DOB: 12/07/1934 MRN: [id number]   Date of Service  10/01/2020  HPI/Events of Note  Patient is asking for a sleep aid, she takes Melatonin at home.  eICU Interventions  Melatonin 3 mg po HS  PRN ordered.        Thomasene Lot Annelle Behrendt 10/01/2020, 11:45 PM

## 2020-10-02 DIAGNOSIS — R6510 Systemic inflammatory response syndrome (SIRS) of non-infectious origin without acute organ dysfunction: Secondary | ICD-10-CM | POA: Diagnosis not present

## 2020-10-02 LAB — GLUCOSE, CAPILLARY
Glucose-Capillary: 205 mg/dL — ABNORMAL HIGH (ref 70–99)
Glucose-Capillary: 155 mg/dL — ABNORMAL HIGH (ref 70–99)
Glucose-Capillary: 226 mg/dL — ABNORMAL HIGH (ref 70–99)
Glucose-Capillary: 165 mg/dL — ABNORMAL HIGH (ref 70–99)
Glucose-Capillary: 251 mg/dL — ABNORMAL HIGH (ref 70–99)
Glucose-Capillary: 169 mg/dL — ABNORMAL HIGH (ref 70–99)

## 2020-10-02 LAB — CBC
HCT: 36.5 % (ref 36.0–46.0)
Hemoglobin: 11.8 g/dL — ABNORMAL LOW (ref 12.0–15.0)
MCH: 31.6 pg (ref 26.0–34.0)
MCHC: 32.3 g/dL (ref 30.0–36.0)
MCV: 97.9 fL (ref 80.0–100.0)
Platelets: 152 10*3/uL (ref 150–400)
RBC: 3.73 MIL/uL — ABNORMAL LOW (ref 3.87–5.11)
RDW: 13.4 % (ref 11.5–15.5)
WBC: 17.1 10*3/uL — ABNORMAL HIGH (ref 4.0–10.5)
nRBC: 0 % (ref 0.0–0.2)

## 2020-10-02 LAB — CULTURE, BLOOD (ROUTINE X 2): Special Requests: ADEQUATE

## 2020-10-02 LAB — BASIC METABOLIC PANEL
Anion gap: 7 (ref 5–15)
BUN: 32 mg/dL — ABNORMAL HIGH (ref 8–23)
CO2: 28 mmol/L (ref 22–32)
Calcium: 8.6 mg/dL — ABNORMAL LOW (ref 8.9–10.3)
Chloride: 102 mmol/L (ref 98–111)
Creatinine, Ser: 1.09 mg/dL — ABNORMAL HIGH (ref 0.44–1.00)
GFR, Estimated: 50 mL/min — ABNORMAL LOW (ref >60)
Glucose, Bld: 207 mg/dL — ABNORMAL HIGH (ref 70–99)
Potassium: 4.3 mmol/L (ref 3.5–5.1)
Sodium: 137 mmol/L (ref 135–145)

## 2020-10-02 MED ORDER — INSULIN ASPART 100 UNIT/ML ~~LOC~~ SOLN
0.0000 [IU] | Freq: Three times a day (TID) | SUBCUTANEOUS | Status: DC
  Administered 2020-10-03: 2 [IU] via SUBCUTANEOUS
  Administered 2020-10-03 (×2): 1 [IU] via SUBCUTANEOUS
  Administered 2020-10-04 (×2): 3 [IU] via SUBCUTANEOUS

## 2020-10-02 MED ORDER — MELATONIN 5 MG PO TABS: 5.0000 mg | ORAL_TABLET | Freq: Every evening | ORAL | Status: DC | PRN

## 2020-10-02 MED ORDER — ALPRAZOLAM 0.25 MG PO TABS
0.2500 mg | ORAL_TABLET | Freq: Once | ORAL | Status: AC
  Administered 2020-10-02: 0.25 mg via ORAL
  Filled 2020-10-02: qty 1

## 2020-10-02 MED ORDER — FUROSEMIDE 10 MG/ML IJ SOLN
40.0000 mg | Freq: Once | INTRAMUSCULAR | Status: AC
  Administered 2020-10-02: 40 mg via INTRAVENOUS
  Filled 2020-10-02: qty 4

## 2020-10-02 MED ORDER — TRAZODONE HCL 50 MG PO TABS
25.0000 mg | ORAL_TABLET | Freq: Every evening | ORAL | Status: DC | PRN
  Administered 2020-10-02: 25 mg via ORAL
  Filled 2020-10-02: qty 1

## 2020-10-02 MED ORDER — INSULIN ASPART 100 UNIT/ML ~~LOC~~ SOLN: 0.0000 [IU] | Freq: Every day | SUBCUTANEOUS | Status: DC

## 2020-10-02 MED ORDER — SODIUM CHLORIDE 0.9 % IV SOLN
2.0000 g | INTRAVENOUS | Status: DC
  Administered 2020-10-02: 2 g via INTRAVENOUS
  Filled 2020-10-02: qty 2
  Filled 2020-10-02: qty 20

## 2020-10-02 MED ORDER — PANTOPRAZOLE SODIUM 40 MG PO TBEC
40.0000 mg | DELAYED_RELEASE_TABLET | Freq: Every day | ORAL | Status: DC
  Administered 2020-10-02 – 2020-10-04 (×3): 40 mg via ORAL
  Filled 2020-10-02 (×3): qty 1

## 2020-10-02 MED ORDER — METOPROLOL TARTRATE 50 MG PO TABS
50.0000 mg | ORAL_TABLET | Freq: Two times a day (BID) | ORAL | Status: DC
  Administered 2020-10-02 – 2020-10-03 (×4): 50 mg via ORAL
  Filled 2020-10-02 (×4): qty 1

## 2020-10-02 NOTE — Progress Notes (Signed)
PROGRESS NOTE    Brianna Lowe  NWG:[id number] DOB: 02/10/1935 DOA: 09/29/2020 PCP: Pcp, No    Brief Narrative:  86 year old female who lives in an independent living facility at [facility], she has history of TAVR, paroxysmal A. fib, type 2 diabetes, mitral stenosis, history of coronary artery disease, recent left femur fracture who presented to the emergency room with progressive weakness for about 24 hours, nausea vomiting and episode of diarrhea.  She was recently treated with antibiotics for rash on her chest.  In the emergency room, she was febrile with temperature 103.  Initially on 2 L oxygen, chest x-ray showing right lower lobe pneumonia.  Lactic acid was 2.  WBC count was 17.  UA was abnormal.  Patient was started on antibiotics with sepsis protocol, she became more short of breath needing heated high flow oxygen and was admitted to ICU. 4/14, admission to medical floor for sepsis and fever, UTI and possible aspiration pneumonia, developed progressive respiratory distress on BiPAP, started on vancomycin and ceftriaxone azithromycin and admitted to ICU.  Also transiently needed on vasopressor. 4/16, transferred to medical floor on 4 L of oxygen.  Assessment & Plan:   Principal Problem:   SIRS (systemic inflammatory response syndrome) (HCC) Active Problems:   Acute febrile illness   ARF (acute renal failure) (HCC)   PAF (paroxysmal atrial fibrillation) (HCC)   Sepsis (HCC)   Acute respiratory failure with hypoxia and hypercapnia (HCC)  Sepsis present on admission secondary to E. coli bacteremia/aspiration pneumonia: Acute hypoxemic respiratory failure. Urine culture, pending Blood cultures, pansensitive E. Coli Chest x-ray with right lower lobe pneumonia Initially treated with broad-spectrum antibiotics, now remains on ceftriaxone.  We will continue until clinical improvement and change to oral to complete 10 days of therapy. Seen by speech, on regular diet.  All-time  aspiration precautions.  Keep on oxygen to keep saturations more than 90%.  Breathing treatments, chest physiotherapy, bronchodilator therapy, mobility today. CT scan of the abdomen pelvis 4/14 with no evidence of intra-abdominal pathology, no urinary retention.  Acute kidney injury: Resolved.  Today on Lasix.  Paroxysmal atrial fibrillation with RVR: All rate control medications were discontinued due to low blood pressure.  Metoprolol 25 mg twice a day started on 4/16, her heart rate control is a still suboptimal.  Blood pressures are adequate.  Increase dose to 50 mg twice a day today.  We will gradually introduce rate control medications including diltiazem once blood pressure allows.  Demand ischemia in a patient with history of coronary artery disease, aortic stenosis status post TAVR: Troponin peaked to 720.  Followed by cardiology.  Recommended conservative management.  Currently remains on beta-blockers, statin, therapeutic on Eliquis.  Essential hypertension: Blood pressures low normal.  Antihypertensives on hold.  Type 2 diabetes: Uncontrolled.  Patient on glipizide and insulin at home.  Currently remains on reduced dose of insulin due to poor oral intake.  Physical debility: Start working with PT OT today.  Anticipate discharge to skilled nursing facility.  Start mobilizing.  Day and night cycle regulation.  PT OT today.  Changed to medical telemetry bed to help her sleep better at night, no bedside monitoring needed.  Increase dose of melatonin 5 mg at night, will add small dose of trazodone to help her sleep.   DVT prophylaxis: apixaban (ELIQUIS) tablet 2.5 mg Start: 10/01/20 2200 apixaban (ELIQUIS) tablet 2.5 mg   Code Status: Full code Family Communication: Patient's brother on the phone, extensive discussion. Disposition Plan: Status is: Inpatient  Remains inpatient appropriate because:IV treatments appropriate due to intensity of illness or inability to take PO and  Inpatient level of care appropriate due to severity of illness   Dispo: The patient is from: Home              Anticipated d/c is to: SNF              Patient currently is not medically stable to d/c.   Difficult to place patient No         Consultants:   Cardiology  PCCM  Procedures:   None  Antimicrobials:  Antibiotics Given (last 72 hours)    Date/Time Action Medication Dose Rate   09/29/20 2109 New Bag/Given   cefTRIAXone (ROCEPHIN) 2 g in sodium chloride 0.9 % 100 mL IVPB 2 g 200 mL/hr   09/29/20 2206 New Bag/Given   azithromycin (ZITHROMAX) 500 mg in sodium chloride 0.9 % 250 mL IVPB 500 mg 250 mL/hr   09/30/20 0850 New Bag/Given   vancomycin (VANCOREADY) IVPB 1250 mg/250 mL 1,250 mg 166.7 mL/hr   09/30/20 1951 New Bag/Given   cefTRIAXone (ROCEPHIN) 2 g in sodium chloride 0.9 % 100 mL IVPB 2 g 200 mL/hr   09/30/20 2023 New Bag/Given   azithromycin (ZITHROMAX) 500 mg in sodium chloride 0.9 % 250 mL IVPB 500 mg 250 mL/hr   10/01/20 2029 New Bag/Given   cefTRIAXone (ROCEPHIN) 2 g in sodium chloride 0.9 % 100 mL IVPB 2 g 200 mL/hr         Subjective: Patient seen and examined.  She was very anxious and wanting to go back to [facility] because she is not getting enough sleep.  She wants to go back to her bed and sleep. Feels tired. Denies any chest pain or shortness of breath.  No sputum production.  Afebrile overnight.  Denies any abdominal pain. Patient was very much worried and wanting to be discharged, I put her brother on the speaker phone and discussed about all active issues going on and patient agreeable to stay to get better. Heart rate 120-130 A. fib on monitor.  Objective: Vitals:   10/01/20 2307 10/02/20 0405 10/02/20 0407 10/02/20 0737  BP: (!) 145/87  (!) 101/53 (!) 155/61  Pulse:   100 (!) 105  Resp: 20  20 18   Temp: 98 F (36.7 C)  98 F (36.7 C) 98 F (36.7 C)  TempSrc: Oral  Oral   SpO2: 98%  92% 97%  Weight:  104.1 kg    Height:         Intake/Output Summary (Last 24 hours) at 10/02/2020 1013 Last data filed at 10/02/2020 0400 Gross per 24 hour  Intake 365 ml  Output 501 ml  Net -136 ml   Filed Weights   09/29/20 1813 10/01/20 0439 10/02/20 0405  Weight: 68 kg 68 kg 104.1 kg    Examination:  General exam: Appears anxious.  Frail and debilitated.  Currently on 4 L oxygen. Respiratory system: Mostly conducted airway sounds.  Currently on 4 L oxygen. Cardiovascular system: S1 & S2 heard, irregularly irregular.  No edema.  She has JVD visible.  Gastrointestinal system: Abdomen is nondistended, soft and nontender. No organomegaly or masses felt. Normal bowel sounds heard. Central nervous system: Alert and oriented. No focal neurological deficits.  Generalized weakness. Extremities: Symmetric 5 x 5 power.  Generalized weakness.   Data Reviewed: I have personally reviewed following labs and imaging studies  CBC: Recent Labs  Lab 09/29/20 1820 09/30/20  0501 09/30/20 0712 09/30/20 0848 10/01/20 0100 10/02/20 0509  WBC 17.3* 18.6*  --   --  19.9* 17.1*  NEUTROABS 15.7*  --   --   --   --   --   HGB 13.9 13.6 14.3 12.9 11.5* 11.8*  HCT 42.9 42.2 42.0 38.0 35.9* 36.5  MCV 95.8 98.4  --   --  96.8 97.9  PLT 200 201  --   --  163 152   Basic Metabolic Panel: Recent Labs  Lab 09/29/20 1820 09/30/20 0501 09/30/20 0712 09/30/20 0848 09/30/20 1051 10/01/20 0100 10/02/20 0509  NA 132* 136 135 136  --  138 137  K 5.0 4.3 4.1 4.1  --  4.3 4.3  CL 96* 98  --   --   --  105 102  CO2 23 25  --   --   --  25 28  GLUCOSE 214* 206*  --   --   --  198* 207*  BUN 25* 27*  --   --   --  37* 32*  CREATININE 1.24* 1.69*  --   --   --  1.76* 1.09*  CALCIUM 8.3* 8.6*  --   --   --  8.1* 8.6*  MG  --   --   --   --  1.7  --   --    GFR: Estimated Creatinine Clearance: 46 mL/min (A) (by C-G formula based on SCr of 1.09 mg/dL (H)). Liver Function Tests: Recent Labs  Lab 09/29/20 1820 09/30/20 0501  AST 25 19  ALT  15 20  ALKPHOS 99 94  BILITOT 3.4* 1.6*  PROT 5.9* 6.1*  ALBUMIN 2.9* 2.9*   Recent Labs  Lab 09/29/20 1820  LIPASE 26   No results for input(s): AMMONIA in the last 168 hours. Coagulation Profile: Recent Labs  Lab 09/30/20 1051  INR 1.8*   Cardiac Enzymes: No results for input(s): CKTOTAL, CKMB, CKMBINDEX, TROPONINI in the last 168 hours. BNP (last 3 results) No results for input(s): PROBNP in the last 8760 hours. HbA1C: Recent Labs    09/30/20 0833  HGBA1C 7.0*   CBG: Recent Labs  Lab 10/01/20 1713 10/01/20 2014 10/01/20 2304 10/02/20 0404 10/02/20 0734  GLUCAP 212* 230* 221* 205* 155*   Lipid Profile: No results for input(s): CHOL, HDL, LDLCALC, TRIG, CHOLHDL, LDLDIRECT in the last 72 hours. Thyroid Function Tests: No results for input(s): TSH, T4TOTAL, FREET4, T3FREE, THYROIDAB in the last 72 hours. Anemia Panel: No results for input(s): VITAMINB12, FOLATE, FERRITIN, TIBC, IRON, RETICCTPCT in the last 72 hours. Sepsis Labs: Recent Labs  Lab 09/29/20 2023 09/30/20 0452  LATICACIDVEN 2.0* 1.8    Recent Results (from the past 240 hour(s))  Resp Panel by RT-PCR (Flu A&B, Covid) Nasopharyngeal Swab     Status: None   Collection Time: 09/29/20  7:22 PM   Specimen: Nasopharyngeal Swab; Nasopharyngeal(NP) swabs in vial transport medium  Result Value Ref Range Status   SARS Coronavirus 2 by RT PCR NEGATIVE NEGATIVE Final    Comment: (NOTE) SARS-CoV-2 target nucleic acids are NOT DETECTED.  The SARS-CoV-2 RNA is generally detectable in upper respiratory specimens during the acute phase of infection. The lowest concentration of SARS-CoV-2 viral copies this assay can detect is 138 copies/mL. A negative result does not preclude SARS-Cov-2 infection and should not be used as the sole basis for treatment or other patient management decisions. A negative result may occur with  improper specimen collection/handling, submission of specimen other  than  nasopharyngeal swab, presence of viral mutation(s) within the areas targeted by this assay, and inadequate number of viral copies(<138 copies/mL). A negative result must be combined with clinical observations, patient history, and epidemiological information. The expected result is Negative.  Fact Sheet for Patients:  BloggerCourse.comhttps://www.fda.gov/media/152166/download  Fact Sheet for Healthcare Providers:  SeriousBroker.ithttps://www.fda.gov/media/152162/download  This test is no t yet approved or cleared by the [place] FDA and  has been authorized for detection and/or diagnosis of SARS-CoV-2 by FDA under an Emergency Use Authorization (EUA). This EUA will remain  in effect (meaning this test can be used) for the duration of the COVID-19 declaration under Section 564(b)(1) of the Act, 21 U.S.C.section 360bbb-3(b)(1), unless the authorization is terminated  or revoked sooner.       Influenza A by PCR NEGATIVE NEGATIVE Final   Influenza B by PCR NEGATIVE NEGATIVE Final    Comment: (NOTE) The Xpert Xpress SARS-CoV-2/FLU/RSV plus assay is intended as an aid in the diagnosis of influenza from Nasopharyngeal swab specimens and should not be used as a sole basis for treatment. Nasal washings and aspirates are unacceptable for Xpert Xpress SARS-CoV-2/FLU/RSV testing.  Fact Sheet for Patients: BloggerCourse.comhttps://www.fda.gov/media/152166/download  Fact Sheet for Healthcare Providers: SeriousBroker.ithttps://www.fda.gov/media/152162/download  This test is not yet approved or cleared by the [place] FDA and has been authorized for detection and/or diagnosis of SARS-CoV-2 by FDA under an Emergency Use Authorization (EUA). This EUA will remain in effect (meaning this test can be used) for the duration of the COVID-19 declaration under Section 564(b)(1) of the Act, 21 U.S.C. section 360bbb-3(b)(1), unless the authorization is terminated or revoked.  Performed at [facility], [address] [place], [place] [address]    Culture, blood (routine x 2)     Status: Abnormal (Preliminary result)   Collection Time: 09/29/20  9:10 PM   Specimen: BLOOD  Result Value Ref Range Status   Specimen Description BLOOD RIGHT ANTECUBITAL  Final   Special Requests   Final    BOTTLES DRAWN AEROBIC AND ANAEROBIC Blood Culture adequate volume   Culture  Setup Time   Final    GRAM NEGATIVE RODS IN BOTH AEROBIC AND ANAEROBIC BOTTLES PHARMD E.WOLFE AT 1256 ON 09/30/2020 BY T.SAAD    Culture (A)  Final    ESCHERICHIA COLI SUSCEPTIBILITIES TO FOLLOW Performed at [facility], [address] [place], [place] [address]    Report Status PENDING  Incomplete  Blood Culture ID Panel (Reflexed)     Status: Abnormal   Collection Time: 09/29/20  9:10 PM  Result Value Ref Range Status   Enterococcus faecalis NOT DETECTED NOT DETECTED Final   Enterococcus Faecium NOT DETECTED NOT DETECTED Final   Listeria monocytogenes NOT DETECTED NOT DETECTED Final   Staphylococcus species NOT DETECTED NOT DETECTED Final   Staphylococcus aureus (BCID) NOT DETECTED NOT DETECTED Final   Staphylococcus epidermidis NOT DETECTED NOT DETECTED Final   Staphylococcus lugdunensis NOT DETECTED NOT DETECTED Final   Streptococcus species NOT DETECTED NOT DETECTED Final   Streptococcus agalactiae NOT DETECTED NOT DETECTED Final   Streptococcus pneumoniae NOT DETECTED NOT DETECTED Final   Streptococcus pyogenes NOT DETECTED NOT DETECTED Final   A.calcoaceticus-baumannii NOT DETECTED NOT DETECTED Final   Bacteroides fragilis NOT DETECTED NOT DETECTED Final   Enterobacterales DETECTED (A) NOT DETECTED Final    Comment: Enterobacterales represent a large order of gram negative bacteria, not a single organism. CRITICAL RESULT CALLED TO, READ BACK BY AND VERIFIED WITH: PHARMD E.WOLFE AT  1256 ON 09/30/2020 BY T.SAAD.    Enterobacter cloacae complex NOT DETECTED NOT DETECTED Final   Escherichia coli DETECTED (A) NOT DETECTED Final    Comment: CRITICAL  RESULT CALLED TO, READ BACK BY AND VERIFIED WITH: PHARMD E.WOLFE AT 1256 ON 09/30/2020 BY T.SAAD.    Klebsiella aerogenes NOT DETECTED NOT DETECTED Final   Klebsiella oxytoca NOT DETECTED NOT DETECTED Final   Klebsiella pneumoniae NOT DETECTED NOT DETECTED Final   Proteus species NOT DETECTED NOT DETECTED Final   Salmonella species NOT DETECTED NOT DETECTED Final   Serratia marcescens NOT DETECTED NOT DETECTED Final   Haemophilus influenzae NOT DETECTED NOT DETECTED Final   Neisseria meningitidis NOT DETECTED NOT DETECTED Final   Pseudomonas aeruginosa NOT DETECTED NOT DETECTED Final   Stenotrophomonas maltophilia NOT DETECTED NOT DETECTED Final   Candida albicans NOT DETECTED NOT DETECTED Final   Candida auris NOT DETECTED NOT DETECTED Final   Candida glabrata NOT DETECTED NOT DETECTED Final   Candida krusei NOT DETECTED NOT DETECTED Final   Candida parapsilosis NOT DETECTED NOT DETECTED Final   Candida tropicalis NOT DETECTED NOT DETECTED Final   Cryptococcus neoformans/gattii NOT DETECTED NOT DETECTED Final   CTX-M ESBL NOT DETECTED NOT DETECTED Final   Carbapenem resistance IMP NOT DETECTED NOT DETECTED Final   Carbapenem resistance KPC NOT DETECTED NOT DETECTED Final   Carbapenem resistance NDM NOT DETECTED NOT DETECTED Final   Carbapenem resist OXA 48 LIKE NOT DETECTED NOT DETECTED Final   Carbapenem resistance VIM NOT DETECTED NOT DETECTED Final    Comment: Performed at [facility], [address] [place], [place] [address]  Culture, blood (routine x 2)     Status: None (Preliminary result)   Collection Time: 09/29/20  9:17 PM   Specimen: BLOOD LEFT HAND  Result Value Ref Range Status   Specimen Description BLOOD LEFT HAND  Final   Special Requests   Final    BOTTLES DRAWN AEROBIC AND ANAEROBIC Blood Culture results may not be optimal due to an inadequate volume of blood received in culture bottles   Culture   Final    NO GROWTH 2 DAYS Performed at [facility], [address], [place], [place] [address]    Report Status PENDING  Incomplete  MRSA PCR Screening     Status: None   Collection Time: 09/30/20 10:00 AM   Specimen: Nasopharyngeal  Result Value Ref Range Status   MRSA by PCR NEGATIVE NEGATIVE Final    Comment:        The GeneXpert MRSA Assay (FDA approved for NASAL specimens only), is one component of a comprehensive MRSA colonization surveillance program. It is not intended to diagnose MRSA infection nor to guide or monitor treatment for MRSA infections. Performed at [facility], [address] [place], [place] [address]          Radiology Studies: DG Swallowing [facility] Pathology  Result Date: 10/01/2020 Objective Swallowing Evaluation: Type of Study: MBS-Modified Barium Swallow Study  Patient Details Name: Brianna Lowe MRN: [id number] Date of Birth: 1935-02-28 Today's Date: 10/01/2020 Time: SLP Start Time (ACUTE ONLY): 1125 -SLP Stop Time (ACUTE ONLY): 1133 SLP Time Calculation (min) (ACUTE ONLY): 8 min Past Medical History: Past Medical History: Diagnosis Date . Atrial fibrillation (HCC)  . AV block  . CAD (coronary artery disease)  . Congestive heart failure (CHF) (HCC)  . COPD (chronic obstructive pulmonary disease) (HCC)  . Diabetes (HCC)  . GERD (gastroesophageal reflux disease)  .  Hypertension  . Left bundle branch block  . Localized edema  . TIA (transient ischemic attack)  . Vitamin D deficiency  Past Surgical History: Past Surgical History: Procedure Laterality Date . heart valve   . REPLACEMENT TOTAL KNEE BILATERAL   HPI: 86 year old female presenting from independent living with complaints of one day history of nausea, vomiting, weakness, and poor PO intake.  Appears to be recently seen for a pruritic rash on her upper chest (ongoing for 2 months) and recently treated for UTI with keflex on 3/4. Admitted for sepsis to [facility].  She developed further respiratory distress with increased work of breathing  and encephalopathic and was placed on BiPAP and given lasix .  Follow up CXR questions new patchy right lung opacity, concerning for aspiration.  Subjective: Pt awake, alert, pleasant, participative Assessment / Plan / Recommendation CHL IP CLINICAL IMPRESSIONS 10/01/2020 Clinical Impression Pt presents with functional oropharyngeal swallowing.  There was transient penetration of thin liquid during pill simulation only.  There was brief oral stasis of tablet.  Pt used a head jerk to assist in transit of tablet.  This resulted in premature spillage of thin liquid wash into the laryngeal vestibule, which was fully cleared during swallow.  Pt denies that she has difficulty taking medications typically, and RN reported no difficulty with today's medications.  Should pt have difficulty swallowing pills, consider giving 1 at a time, or whole with puree.  Recommend regular texture diet with thin liquids.  Pt has no further ST needs at this time; SLP will sign off. SLP Visit Diagnosis Dysphagia, unspecified (R13.10) Attention and concentration deficit following -- Frontal lobe and executive function deficit following -- Impact on safety and function No limitations   CHL IP TREATMENT RECOMMENDATION 10/01/2020 Treatment Recommendations No treatment recommended at this time   Prognosis 10/01/2020 Prognosis for Safe Diet Advancement (No Data) Barriers to Reach Goals -- Barriers/Prognosis Comment -- CHL IP DIET RECOMMENDATION 10/01/2020 SLP Diet Recommendations Regular solids;Thin liquid Liquid Administration via Cup;Straw Medication Administration Whole meds with liquid Compensations -- Postural Changes --   CHL IP OTHER RECOMMENDATIONS 10/01/2020 Recommended Consults -- Oral Care Recommendations Oral care BID Other Recommendations --   CHL IP FOLLOW UP RECOMMENDATIONS 10/01/2020 Follow up Recommendations None   CHL IP FREQUENCY AND DURATION 10/01/2020 Speech Therapy Frequency (ACUTE ONLY) (No Data) Treatment Duration --      CHL  IP ORAL PHASE 10/01/2020 Oral Phase WFL Oral - Pudding Teaspoon -- Oral - Pudding Cup -- Oral - Honey Teaspoon -- Oral - Honey Cup -- Oral - Nectar Teaspoon -- Oral - Nectar Cup -- Oral - Nectar Straw -- Oral - Thin Teaspoon -- Oral - Thin Cup WFL Oral - Thin Straw WFL Oral - Puree WFL Oral - Mech Soft -- Oral - Regular WFL Oral - Multi-Consistency -- Oral - Pill Reduced posterior propulsion Oral Phase - Comment --  CHL IP PHARYNGEAL PHASE 10/01/2020 Pharyngeal Phase WFL Pharyngeal- Pudding Teaspoon -- Pharyngeal -- Pharyngeal- Pudding Cup -- Pharyngeal -- Pharyngeal- Honey Teaspoon -- Pharyngeal -- Pharyngeal- Honey Cup -- Pharyngeal -- Pharyngeal- Nectar Teaspoon -- Pharyngeal -- Pharyngeal- Nectar Cup -- Pharyngeal -- Pharyngeal- Nectar Straw -- Pharyngeal -- Pharyngeal- Thin Teaspoon -- Pharyngeal -- Pharyngeal- Thin Cup [facility] Pharyngeal Material enters airway, remains ABOVE vocal cords then ejected out Pharyngeal- Thin Straw WFL Pharyngeal Material does not enter airway Pharyngeal- Puree WFL Pharyngeal Material does not enter airway Pharyngeal- Mechanical Soft -- Pharyngeal -- Pharyngeal- Regular WFL Pharyngeal Material does  not enter airway Pharyngeal- Multi-consistency -- Pharyngeal -- Pharyngeal- Pill WFL Pharyngeal Material enters airway, remains ABOVE vocal cords then ejected out Pharyngeal Comment --  CHL IP CERVICAL ESOPHAGEAL PHASE 10/01/2020 Cervical Esophageal Phase WFL Pudding Teaspoon -- Pudding Cup -- Honey Teaspoon -- Honey Cup -- Nectar Teaspoon -- Nectar Cup -- Nectar Straw -- Thin Teaspoon -- Thin Cup -- Thin Straw -- Puree -- Mechanical Soft -- Regular -- Multi-consistency -- Pill -- Cervical Esophageal Comment -- Leigh E Borum 10/01/2020, 11:59 AM              ECHOCARDIOGRAM COMPLETE  Result Date: 09/30/2020    ECHOCARDIOGRAM REPORT   Patient Name:   Brianna Lowe [facility] Date of Exam: 09/30/2020 Medical Rec #:  [id number]         Height:       64.0 in Accession #:    [id number]        Weight:        150.0 lb Date of Birth:  [birth year]         BSA:          1.731 m Patient Age:    85 years          BP:           110/56 mmHg Patient Gender: F                 HR:           98 bpm. Exam Location:  Inpatient Procedure: 2D Echo, Cardiac Doppler and Color Doppler Indications:    CHF  History:        Patient has no prior history of Echocardiogram examinations.                 Pulmonary HTN, Aortic Valve Disease and Mitral Valve Disease,                 Arrythmias:Atrial Fibrillation; Risk Factors:Former Smoker. S/P                 bioprosthetic aortic valve 05/2020.                 Aortic Valve: 23 mm Sapien prosthetic, stented (TAVR) valve is                 present in the aortic position. Procedure Date: 06/03/20.  Sonographer:    Ross Ludwig RDCS (AE) Referring Phys: [id number] PAULA B SIMPSON IMPRESSIONS  1. Left ventricular ejection fraction, by estimation, is 60 to 65%. The left ventricle has normal function. The left ventricle has no regional wall motion abnormalities. There is moderate concentric left ventricular hypertrophy. Left ventricular diastolic function could not be evaluated. Elevated left ventricular end-diastolic pressure.  2. Right ventricular systolic function was not well visualized. The right ventricular size is not well visualized.  3. Left atrial size was severely dilated.  4. Right atrial size was mildly dilated.  5. The mitral valve is rheumatic. Trivial mitral valve regurgitation. Moderate mitral stenosis. The mean mitral valve gradient is 6.0 mmHg. Moderate to severe mitral annular calcification.  6. The aortic valve has been repaired/replaced. Aortic valve regurgitation not well visualized. There is a 23 mm Sapien prosthetic (TAVR) valve present in the aortic position. Procedure Date: 06/03/20. Aortic valve mean gradient measures 13.0 mmHg.  7. The inferior vena cava is dilated in size with <50% respiratory variability, suggesting right atrial pressure of 15 mmHg. Comparison(s): Prior images  unable to be directly viewed, comparison made  by report only. FINDINGS  Left Ventricle: Left ventricular ejection fraction, by estimation, is 60 to 65%. The left ventricle has normal function. The left ventricle has no regional wall motion abnormalities. The left ventricular internal cavity size was normal in size. There is  moderate concentric left ventricular hypertrophy. Left ventricular diastolic function could not be evaluated due to mitral annular calcification (moderate or greater). Left ventricular diastolic function could not be evaluated. Elevated left ventricular  end-diastolic pressure. Right Ventricle: The right ventricular size is not well visualized. Right vetricular wall thickness was not well visualized. Right ventricular systolic function was not well visualized. Left Atrium: Left atrial size was severely dilated. Right Atrium: Right atrial size was mildly dilated. Pericardium: There is no evidence of pericardial effusion. Mitral Valve: The mitral valve is rheumatic. Moderate to severe mitral annular calcification. Trivial mitral valve regurgitation. Moderate mitral valve stenosis. MV peak gradient, 12.2 mmHg. The mean mitral valve gradient is 6.0 mmHg. Tricuspid Valve: The tricuspid valve is grossly normal. Tricuspid valve regurgitation is trivial. Aortic Valve: The aortic valve has been repaired/replaced. Aortic valve regurgitation not well visualized. Aortic valve mean gradient measures 13.0 mmHg. Aortic valve peak gradient measures 24.7 mmHg. Aortic valve area, by VTI measures 2.15 cm. There is  a 23 mm Sapien prosthetic, stented (TAVR) valve present in the aortic position. Procedure Date: 06/03/20. Pulmonic Valve: The pulmonic valve was not well visualized. Pulmonic valve regurgitation is not visualized. Aorta: The aortic root was not well visualized, the ascending aorta was not well visualized and the aortic arch was not well visualized. Venous: The inferior vena cava is dilated in size with  less than 50% respiratory variability, suggesting right atrial pressure of 15 mmHg. IAS/Shunts: The atrial septum is grossly normal.  LEFT VENTRICLE PLAX 2D LVIDd:         4.80 cm  Diastology LVIDs:         3.10 cm  LV e' lateral:   4.46 cm/s LV PW:         1.30 cm  LV E/e' lateral: 37.4 LV IVS:        1.40 cm LVOT diam:     2.40 cm LV SV:         86 LV SV Index:   49 LVOT Area:     4.52 cm  RIGHT VENTRICLE            IVC RV Basal diam:  3.40 cm    IVC diam: 2.50 cm RV S prime:     8.59 cm/s TAPSE (M-mode): 2.0 cm LEFT ATRIUM           Index       RIGHT ATRIUM           Index LA diam:      3.60 cm 2.08 cm/m  RA Area:     13.90 cm LA Vol (A2C): 57.2 ml 33.04 ml/m RA Volume:   35.00 ml  20.22 ml/m LA Vol (A4C): 78.2 ml 45.17 ml/m  AORTIC VALVE AV Area (Vmax):    1.70 cm AV Area (Vmean):   1.76 cm AV Area (VTI):     2.15 cm AV Vmax:           248.33 cm/s AV Vmean:          166.667 cm/s AV VTI:            0.398 m AV Peak Grad:      24.7 mmHg AV Mean Grad:  13.0 mmHg LVOT Vmax:         93.10 cm/s LVOT Vmean:        64.800 cm/s LVOT VTI:          0.189 m LVOT/AV VTI ratio: 0.48  AORTA Ao Root diam: 2.60 cm Ao Asc diam:  2.80 cm MITRAL VALVE MV Area (PHT): 3.89 cm     SHUNTS MV Area VTI:   2.13 cm     Systemic VTI:  0.19 m MV Peak grad:  12.2 mmHg    Systemic Diam: 2.40 cm MV Mean grad:  6.0 mmHg MV Vmax:       1.75 m/s MV Vmean:      112.0 cm/s MV Decel Time: 195 msec MV E velocity: 167.00 cm/s MV A velocity: 160.00 cm/s MV E/A ratio:  1.04 Jodelle Red MD Electronically signed by Jodelle Red MD Signature Date/Time: 09/30/2020/5:41:36 PM    Final         Scheduled Meds: . apixaban  2.5 mg Oral BID  . Chlorhexidine Gluconate Cloth  6 each Topical Daily  . ezetimibe  10 mg Oral Daily  . furosemide  40 mg Intravenous Once  . insulin aspart  0-9 Units Subcutaneous Q4H  . insulin detemir  10 Units Subcutaneous BID  . metoprolol tartrate  50 mg Oral BID  . pantoprazole (PROTONIX)  IV  40 mg Intravenous Q24H   Continuous Infusions: . sodium chloride    . cefTRIAXone (ROCEPHIN)  IV 2 g (10/01/20 2029)  . norepinephrine (LEVOPHED) Adult infusion Stopped (10/01/20 1930)     LOS: 2 days    Time spent: 40 minutes    Dorcas Carrow, MD [facility] Pager [phone]

## 2020-10-02 NOTE — Progress Notes (Signed)
Progress Note  Patient Name: Brianna Lowe Date of Encounter: 10/02/2020   [facility] Cardiologist: Dr Donnamarie Poag (HP)  Dr Reuel Boom [facility])  Subjective   "I didn't sleep good last night.  I just don't feel good"   No CP  No SOB   Inpatient Medications    Scheduled Meds: . apixaban  2.5 mg Oral BID  . Chlorhexidine Gluconate Cloth  6 each Topical Daily  . ezetimibe  10 mg Oral Daily  . insulin aspart  0-9 Units Subcutaneous Q4H  . insulin detemir  10 Units Subcutaneous BID  . metoprolol tartrate  50 mg Oral BID  . pantoprazole (PROTONIX) IV  40 mg Intravenous Q24H   Continuous Infusions: . sodium chloride    . cefTRIAXone (ROCEPHIN)  IV 2 g (10/01/20 2029)  . norepinephrine (LEVOPHED) Adult infusion Stopped (10/01/20 1930)   PRN Meds: acetaminophen **OR** acetaminophen, ipratropium-albuterol, lip balm, melatonin   Vital Signs    Vitals:   10/01/20 2307 10/02/20 0405 10/02/20 0407 10/02/20 0737  BP: (!) 145/87  (!) 101/53 (!) 155/61  Pulse:   100 (!) 105  Resp: 20  20 18   Temp: 98 F (36.7 C)  98 F (36.7 C) 98 F (36.7 C)  TempSrc: Oral  Oral   SpO2: 98%  92% 97%  Weight:  104.1 kg    Height:        Intake/Output Summary (Last 24 hours) at 10/02/2020 0924 Last data filed at 10/02/2020 0400 Gross per 24 hour  Intake 365 ml  Output 501 ml  Net -136 ml   Last 3 Weights 10/02/2020 10/01/2020 09/29/2020  Weight (lbs) 229 lb 8 oz 149 lb 14.6 oz 150 lb  Weight (kg) 104.1 kg 68 kg 68.04 kg      Telemetry    Sr - Personally Reviewed  ECG    -None new Personally Reviewed  Physical Exam   GEN: Obese 86 yo in acute distress.   Neck: Neck is increased   Cardiac: RRR, no murmurs Respiratory: Rel clear bilaterally  GI: Soft, nontender, non-distended  MS: No edema; No deformity. Neuro:  Nonfocal  Psych: Normal affect   Labs    High Sensitivity Troponin:   Recent Labs  Lab 09/29/20 2011 09/30/20 0501 09/30/20 0747 09/30/20 1051 10/01/20 1017   TROPONINIHS 19* 54* 201* 720* 610*      Chemistry Recent Labs  Lab 09/29/20 1820 09/30/20 0501 09/30/20 0712 09/30/20 0848 10/01/20 0100 10/02/20 0509  NA 132* 136   < > 136 138 137  K 5.0 4.3   < > 4.1 4.3 4.3  CL 96* 98  --   --  105 102  CO2 23 25  --   --  25 28  GLUCOSE 214* 206*  --   --  198* 207*  BUN 25* 27*  --   --  37* 32*  CREATININE 1.24* 1.69*  --   --  1.76* 1.09*  CALCIUM 8.3* 8.6*  --   --  8.1* 8.6*  PROT 5.9* 6.1*  --   --   --   --   ALBUMIN 2.9* 2.9*  --   --   --   --   AST 25 19  --   --   --   --   ALT 15 20  --   --   --   --   ALKPHOS 99 94  --   --   --   --   BILITOT 3.4*  1.6*  --   --   --   --   GFRNONAA 43* 30*  --   --  28* 50*  ANIONGAP 13 13  --   --  8 7   < > = values in this interval not displayed.     Hematology Recent Labs  Lab 09/30/20 0501 09/30/20 0712 09/30/20 0848 10/01/20 0100 10/02/20 0509  WBC 18.6*  --   --  19.9* 17.1*  RBC 4.29  --   --  3.71* 3.73*  HGB 13.6   < > 12.9 11.5* 11.8*  HCT 42.2   < > 38.0 35.9* 36.5  MCV 98.4  --   --  96.8 97.9  MCH 31.7  --   --  31.0 31.6  MCHC 32.2  --   --  32.0 32.3  RDW 13.6  --   --  13.5 13.4  PLT 201  --   --  163 152   < > = values in this interval not displayed.    BNP Recent Labs  Lab 09/30/20 0453  BNP 384.7*     DDimer No results for input(s): DDIMER in the last 168 hours.   Radiology    DG Swallowing Func-Speech Pathology  Result Date: 10/01/2020 Objective Swallowing Evaluation: Type of Study: MBS-Modified Barium Swallow Study  Patient Details Name: Brianna Lowe MRN: [id number] Date of Birth: June 10, 1935 Today's Date: 10/01/2020 Time: SLP Start Time (ACUTE ONLY): 1125 -SLP Stop Time (ACUTE ONLY): 1133 SLP Time Calculation (min) (ACUTE ONLY): 8 min Past Medical History: Past Medical History: Diagnosis Date . Atrial fibrillation (HCC)  . AV block  . CAD (coronary artery disease)  . Congestive heart failure (CHF) (HCC)  . COPD (chronic obstructive pulmonary  disease) (HCC)  . Diabetes (HCC)  . GERD (gastroesophageal reflux disease)  . Hypertension  . Left bundle branch block  . Localized edema  . TIA (transient ischemic attack)  . Vitamin D deficiency  Past Surgical History: Past Surgical History: Procedure Laterality Date . heart valve   . REPLACEMENT TOTAL KNEE BILATERAL   HPI: 86 year old female presenting from independent living with complaints of one day history of nausea, vomiting, weakness, and poor PO intake.  Appears to be recently seen for a pruritic rash on her upper chest (ongoing for 2 months) and recently treated for UTI with keflex on 3/4. Admitted for sepsis to [facility].  She developed further respiratory distress with increased work of breathing and encephalopathic and was placed on BiPAP and given lasix 40mg .  Follow up CXR questions new patchy right lung opacity, concerning for aspiration.  Subjective: Pt awake, alert, pleasant, participative Assessment / Plan / Recommendation CHL IP CLINICAL IMPRESSIONS 10/01/2020 Clinical Impression Pt presents with functional oropharyngeal swallowing.  There was transient penetration of thin liquid during pill simulation only.  There was brief oral stasis of tablet.  Pt used a head jerk to assist in transit of tablet.  This resulted in premature spillage of thin liquid wash into the laryngeal vestibule, which was fully cleared during swallow.  Pt denies that she has difficulty taking medications typically, and RN reported no difficulty with today's medications.  Should pt have difficulty swallowing pills, consider giving 1 at a time, or whole with puree.  Recommend regular texture diet with thin liquids.  Pt has no further ST needs at this time; SLP will sign off. SLP Visit Diagnosis Dysphagia, unspecified (R13.10) Attention and concentration deficit following -- Frontal lobe and executive function deficit following --  Impact on safety and function No limitations   CHL IP TREATMENT RECOMMENDATION 10/01/2020 Treatment  Recommendations No treatment recommended at this time   Prognosis 10/01/2020 Prognosis for Safe Diet Advancement (No Data) Barriers to Reach Goals -- Barriers/Prognosis Comment -- CHL IP DIET RECOMMENDATION 10/01/2020 SLP Diet Recommendations Regular solids;Thin liquid Liquid Administration via Cup;Straw Medication Administration Whole meds with liquid Compensations -- Postural Changes --   CHL IP OTHER RECOMMENDATIONS 10/01/2020 Recommended Consults -- Oral Care Recommendations Oral care BID Other Recommendations --   CHL IP FOLLOW UP RECOMMENDATIONS 10/01/2020 Follow up Recommendations None   CHL IP FREQUENCY AND DURATION 10/01/2020 Speech Therapy Frequency (ACUTE ONLY) (No Data) Treatment Duration --      CHL IP ORAL PHASE 10/01/2020 Oral Phase WFL Oral - Pudding Teaspoon -- Oral - Pudding Cup -- Oral - Honey Teaspoon -- Oral - Honey Cup -- Oral - Nectar Teaspoon -- Oral - Nectar Cup -- Oral - Nectar Straw -- Oral - Thin Teaspoon -- Oral - Thin Cup WFL Oral - Thin Straw WFL Oral - Puree WFL Oral - Mech Soft -- Oral - Regular WFL Oral - Multi-Consistency -- Oral - Pill Reduced posterior propulsion Oral Phase - Comment --  CHL IP PHARYNGEAL PHASE 10/01/2020 Pharyngeal Phase WFL Pharyngeal- Pudding Teaspoon -- Pharyngeal -- Pharyngeal- Pudding Cup -- Pharyngeal -- Pharyngeal- Honey Teaspoon -- Pharyngeal -- Pharyngeal- Honey Cup -- Pharyngeal -- Pharyngeal- Nectar Teaspoon -- Pharyngeal -- Pharyngeal- Nectar Cup -- Pharyngeal -- Pharyngeal- Nectar Straw -- Pharyngeal -- Pharyngeal- Thin Teaspoon -- Pharyngeal -- Pharyngeal- Thin Cup [facility] Pharyngeal Material enters airway, remains ABOVE vocal cords then ejected out Pharyngeal- Thin Straw WFL Pharyngeal Material does not enter airway Pharyngeal- Puree WFL Pharyngeal Material does not enter airway Pharyngeal- Mechanical Soft -- Pharyngeal -- Pharyngeal- Regular WFL Pharyngeal Material does not enter airway Pharyngeal- Multi-consistency -- Pharyngeal -- Pharyngeal- Pill WFL  Pharyngeal Material enters airway, remains ABOVE vocal cords then ejected out Pharyngeal Comment --  CHL IP CERVICAL ESOPHAGEAL PHASE 10/01/2020 Cervical Esophageal Phase WFL Pudding Teaspoon -- Pudding Cup -- Honey Teaspoon -- Honey Cup -- Nectar Teaspoon -- Nectar Cup -- Nectar Straw -- Thin Teaspoon -- Thin Cup -- Thin Straw -- Puree -- Mechanical Soft -- Regular -- Multi-consistency -- Pill -- Cervical Esophageal Comment -- Leigh E Borum 10/01/2020, 11:59 AM              ECHOCARDIOGRAM COMPLETE  Result Date: 09/30/2020    ECHOCARDIOGRAM REPORT   Patient Name:   [facility]I [facility] Date of Exam: 09/30/2020 Medical Rec #:  [id number]         Height:       64.0 in Accession #:    [id number]        Weight:       150.0 lb Date of Birth:  1934-07-14         BSA:          1.731 m Patient Age:    85 years          BP:           110/56 mmHg Patient Gender: F                 HR:           98 bpm. Exam Location:  Inpatient Procedure: 2D Echo, Cardiac Doppler and Color Doppler Indications:    CHF  History:        Patient has no prior history of Echocardiogram examinations.  Pulmonary HTN, Aortic Valve Disease and Mitral Valve Disease,                 Arrythmias:Atrial Fibrillation; Risk Factors:Former Smoker. S/P                 bioprosthetic aortic valve 05/2020.                 Aortic Valve: 23 mm Sapien prosthetic, stented (TAVR) valve is                 present in the aortic position. Procedure Date: 06/03/20.  Sonographer:    Karn Derk Ludwig RDCS (AE) Referring Phys: [id number] Kelvin Burpee B SIMPSON IMPRESSIONS  1. Left ventricular ejection fraction, by estimation, is 60 to 65%. The left ventricle has normal function. The left ventricle has no regional wall motion abnormalities. There is moderate concentric left ventricular hypertrophy. Left ventricular diastolic function could not be evaluated. Elevated left ventricular end-diastolic pressure.  2. Right ventricular systolic function was not well visualized. The right  ventricular size is not well visualized.  3. Left atrial size was severely dilated.  4. Right atrial size was mildly dilated.  5. The mitral valve is rheumatic. Trivial mitral valve regurgitation. Moderate mitral stenosis. The mean mitral valve gradient is 6.0 mmHg. Moderate to severe mitral annular calcification.  6. The aortic valve has been repaired/replaced. Aortic valve regurgitation not well visualized. There is a 23 mm Sapien prosthetic (TAVR) valve present in the aortic position. Procedure Date: 06/03/20. Aortic valve mean gradient measures 13.0 mmHg.  7. The inferior vena cava is dilated in size with <50% respiratory variability, suggesting right atrial pressure of 15 mmHg. Comparison(s): Prior images unable to be directly viewed, comparison made by report only. FINDINGS  Left Ventricle: Left ventricular ejection fraction, by estimation, is 60 to 65%. The left ventricle has normal function. The left ventricle has no regional wall motion abnormalities. The left ventricular internal cavity size was normal in size. There is  moderate concentric left ventricular hypertrophy. Left ventricular diastolic function could not be evaluated due to mitral annular calcification (moderate or greater). Left ventricular diastolic function could not be evaluated. Elevated left ventricular  end-diastolic pressure. Right Ventricle: The right ventricular size is not well visualized. Right vetricular wall thickness was not well visualized. Right ventricular systolic function was not well visualized. Left Atrium: Left atrial size was severely dilated. Right Atrium: Right atrial size was mildly dilated. Pericardium: There is no evidence of pericardial effusion. Mitral Valve: The mitral valve is rheumatic. Moderate to severe mitral annular calcification. Trivial mitral valve regurgitation. Moderate mitral valve stenosis. MV peak gradient, 12.2 mmHg. The mean mitral valve gradient is 6.0 mmHg. Tricuspid Valve: The tricuspid valve is  grossly normal. Tricuspid valve regurgitation is trivial. Aortic Valve: The aortic valve has been repaired/replaced. Aortic valve regurgitation not well visualized. Aortic valve mean gradient measures 13.0 mmHg. Aortic valve peak gradient measures 24.7 mmHg. Aortic valve area, by VTI measures 2.15 cm. There is  a 23 mm Sapien prosthetic, stented (TAVR) valve present in the aortic position. Procedure Date: 06/03/20. Pulmonic Valve: The pulmonic valve was not well visualized. Pulmonic valve regurgitation is not visualized. Aorta: The aortic root was not well visualized, the ascending aorta was not well visualized and the aortic arch was not well visualized. Venous: The inferior vena cava is dilated in size with less than 50% respiratory variability, suggesting right atrial pressure of 15 mmHg. IAS/Shunts: The atrial septum is grossly normal.  LEFT VENTRICLE PLAX  2D LVIDd:         4.80 cm  Diastology LVIDs:         3.10 cm  LV e' lateral:   4.46 cm/s LV PW:         1.30 cm  LV E/e' lateral: 37.4 LV IVS:        1.40 cm LVOT diam:     2.40 cm LV SV:         86 LV SV Index:   49 LVOT Area:     4.52 cm  RIGHT VENTRICLE            IVC RV Basal diam:  3.40 cm    IVC diam: 2.50 cm RV S prime:     8.59 cm/s TAPSE (M-mode): 2.0 cm LEFT ATRIUM           Index       RIGHT ATRIUM           Index LA diam:      3.60 cm 2.08 cm/m  RA Area:     13.90 cm LA Vol (A2C): 57.2 ml 33.04 ml/m RA Volume:   35.00 ml  20.22 ml/m LA Vol (A4C): 78.2 ml 45.17 ml/m  AORTIC VALVE AV Area (Vmax):    1.70 cm AV Area (Vmean):   1.76 cm AV Area (VTI):     2.15 cm AV Vmax:           248.33 cm/s AV Vmean:          166.667 cm/s AV VTI:            0.398 m AV Peak Grad:      24.7 mmHg AV Mean Grad:      13.0 mmHg LVOT Vmax:         93.10 cm/s LVOT Vmean:        64.800 cm/s LVOT VTI:          0.189 m LVOT/AV VTI ratio: 0.48  AORTA Ao Root diam: 2.60 cm Ao Asc diam:  2.80 cm MITRAL VALVE MV Area (PHT): 3.89 cm     SHUNTS MV Area VTI:   2.13 cm      Systemic VTI:  0.19 m MV Peak grad:  12.2 mmHg    Systemic Diam: 2.40 cm MV Mean grad:  6.0 mmHg MV Vmax:       1.75 m/s MV Vmean:      112.0 cm/s MV Decel Time: 195 msec MV E velocity: 167.00 cm/s MV A velocity: 160.00 cm/s MV E/A ratio:  1.04 Jodelle RedBridgette Christopher MD Electronically signed by Jodelle RedBridgette Christopher MD Signature Date/Time: 09/30/2020/5:41:36 PM    Final     Cardiac Studies   Echo:   09/30/20  1. Left ventricular ejection fraction, by estimation, is 60 to 65%. The  left ventricle has normal function. The left ventricle has no regional  wall motion abnormalities. There is moderate concentric left ventricular  hypertrophy. Left ventricular  diastolic function could not be evaluated. Elevated left ventricular  end-diastolic pressure.  2. Right ventricular systolic function was not well visualized. The right  ventricular size is not well visualized.  3. Left atrial size was severely dilated.  4. Right atrial size was mildly dilated.  5. The mitral valve is rheumatic. Trivial mitral valve regurgitation.  Moderate mitral stenosis. The mean mitral valve gradient is 6.0 mmHg.  Moderate to severe mitral annular calcification.  6. The aortic valve has been repaired/replaced. Aortic valve  regurgitation not well visualized.  There is a 23 mm Sapien prosthetic  (TAVR) valve present in the aortic position. Procedure Date: 06/03/20.  Aortic valve mean gradient measures 13.0 mmHg.  7. The inferior vena cava is dilated in size with <50% respiratory  variability, suggesting right atrial pressure of 15 mmHg.   Echo  08/15/20  SUMMARY Left ventricular systolic function is normal. LV ejection fraction = 65-70%. No segmental wall motion abnormalities seen in the left ventricle The right ventricle is normal in size and function. The left atrial size is normal. 79mm Sapien bioprosthetic valve in the aortic position. Mean pressure gradient of 15 mmHg. There is no aortic  regurgitation. There is mild aortic stenosis. There is moderate mitral stenosis. Mean transmitral pressure gradient of 9 mmHg. The heart rate for the mean mitral valve gradient is 94 BPM. There is no significant change in comparison with the last study.  Cath 04/06/20  DIAGNOSTIC FINDINGS:   1. Severe aortic stenosis, mean gradient 47 mmHg. Aortic valve area 0.59  cm.  2. Normal cardiac output, 4.8 L/min (cardiac index 2.3).  3. Moderate pulmonary hypertension, 54/20 6 mmHg.  4. Elevated right atrial and left atrial filling pressures, consistent  with intravascular salt/volume overload.  5. Obstructive coronary artery disease involving the mid RCA and first  diagonal. Borderline mid LAD disease.   COMPLICATIONS: None   RECOMMENDATIONS:   Continue diuretic therapy, given the patient's significant salt/volume  overload.   A low-salt diet, antihypertensive therapy, and regular exercise are  strongly recommended, as these are the hallmarks of management of chronic  heart failure with preserved ejection fraction.   Given that the patient has no angina, revascularization of her coronary  arteries is not necessary at this time. We will manage her coronary  artery disease medically for now.   I will have a clinic appointment with the patient and family members  within the next few weeks to discuss management of her severe aortic  stenosis. Specifically, we will discuss whether she wishes to proceed  with a transcatheter aortic valve replacement versus a palliative care  approach.   Patient Profile      Pecola Haxton is a 86 y.o. female with a hx of aortic stenosis s/p TAVR 06/03/2020, mitral stenosis (rheumatic fever at 86yo), DM2, history of TIA, paroxysmal atrial fibrillation on chronic anticoagulation, borderline obstructive CAD with first diagonal lesion at 70% and a mid RCA lesion at 80% per cath 03/2020, HTN, HLD, carotid artery stenosis with a known 70%  stenosis of the LICA, pulmonary hypertension and former tobacco use who is being seen today for the evaluation of elevated troponin at the request of Dr. Everardo All.  Assessment & Plan    1   CAD/ Elevated troponin    Pt with known CAD        Peak Trop 720  Lat check 610    Most likely represents demand ischemia in setting of sepsis  Pt without angina   Echo with normal LVEF  As per recom yesterday, continue medical Rx.  No ischemic work up unless symtpoms develop, change in clnical course    2  S/P TAVR  05/2020 [facility], Dr Reuel Boom)  23 mm Sapien bioprosthesis     3  Mod Mitral stenosis  REmains mod on this echo    Will give one dose of IV lasix today as JVP is increased and pt on O2    4.  Sepsis  Felt due to UTI and aspiration neumonia    WBC is  down from peak   17.1 today   As per IM  On ABX    4  Blood pressure    At home was on 100 Toprol XL; aldactone 25 bid; Dilt 120 daily  Furosemide 40 mg daily    Now on b blocker only   Dose increased this am    Would slowly titrate as BP allows     5  Hx PAF  Pt has been maintained  on Eliquis bid  As well as dilt and toprol   Continue eliquis  B Metoprolol back on and dose increased today     5   CVZ   70% LICA  Follows with vascular surgery  6  HL On Zetia  Intolerant to statins  Rx Zetoa     Will follow with Dr Donnamarie Poag to discuss/ review  ? Repatha  For questions or updates, please contact [facility] Please consult [email or web address] for contact info under        Signed, Dietrich Pates, MD  10/02/2020, 9:24 AM

## 2020-10-03 DIAGNOSIS — R6520 Severe sepsis without septic shock: Secondary | ICD-10-CM | POA: Diagnosis not present

## 2020-10-03 DIAGNOSIS — R509 Fever, unspecified: Secondary | ICD-10-CM

## 2020-10-03 DIAGNOSIS — A419 Sepsis, unspecified organism: Secondary | ICD-10-CM | POA: Diagnosis not present

## 2020-10-03 DIAGNOSIS — J9601 Acute respiratory failure with hypoxia: Secondary | ICD-10-CM | POA: Diagnosis not present

## 2020-10-03 DIAGNOSIS — J9602 Acute respiratory failure with hypercapnia: Secondary | ICD-10-CM | POA: Diagnosis not present

## 2020-10-03 DIAGNOSIS — R6510 Systemic inflammatory response syndrome (SIRS) of non-infectious origin without acute organ dysfunction: Secondary | ICD-10-CM | POA: Diagnosis not present

## 2020-10-03 LAB — CBC
HCT: 40.9 % (ref 36.0–46.0)
Hemoglobin: 13 g/dL (ref 12.0–15.0)
MCH: 31 pg (ref 26.0–34.0)
MCHC: 31.8 g/dL (ref 30.0–36.0)
MCV: 97.6 fL (ref 80.0–100.0)
Platelets: 164 10*3/uL (ref 150–400)
RBC: 4.19 MIL/uL (ref 3.87–5.11)
RDW: 13.1 % (ref 11.5–15.5)
WBC: 11 10*3/uL — ABNORMAL HIGH (ref 4.0–10.5)
nRBC: 0 % (ref 0.0–0.2)

## 2020-10-03 LAB — PHOSPHORUS: Phosphorus: 3.1 mg/dL (ref 2.5–4.6)

## 2020-10-03 LAB — GLUCOSE, CAPILLARY
Glucose-Capillary: 148 mg/dL — ABNORMAL HIGH (ref 70–99)
Glucose-Capillary: 133 mg/dL — ABNORMAL HIGH (ref 70–99)
Glucose-Capillary: 154 mg/dL — ABNORMAL HIGH (ref 70–99)
Glucose-Capillary: 143 mg/dL — ABNORMAL HIGH (ref 70–99)

## 2020-10-03 LAB — URINE CULTURE: Culture: 50000 — AB

## 2020-10-03 LAB — BASIC METABOLIC PANEL
Anion gap: 6 (ref 5–15)
BUN: 22 mg/dL (ref 8–23)
CO2: 34 mmol/L — ABNORMAL HIGH (ref 22–32)
Calcium: 9 mg/dL (ref 8.9–10.3)
Chloride: 102 mmol/L (ref 98–111)
Creatinine, Ser: 0.84 mg/dL (ref 0.44–1.00)
GFR, Estimated: >60 mL/min (ref >60)
Glucose, Bld: 123 mg/dL — ABNORMAL HIGH (ref 70–99)
Potassium: 4.1 mmol/L (ref 3.5–5.1)
Sodium: 142 mmol/L (ref 135–145)

## 2020-10-03 LAB — MAGNESIUM: Magnesium: 2 mg/dL (ref 1.7–2.4)

## 2020-10-03 MED ORDER — CEFAZOLIN SODIUM-DEXTROSE 2-4 GM/100ML-% IV SOLN
2.0000 g | Freq: Three times a day (TID) | INTRAVENOUS | Status: AC
  Administered 2020-10-03 – 2020-10-04 (×3): 2 g via INTRAVENOUS
  Filled 2020-10-03 (×3): qty 100

## 2020-10-03 MED ORDER — APIXABAN 5 MG PO TABS
5.0000 mg | ORAL_TABLET | Freq: Two times a day (BID) | ORAL | Status: DC
  Administered 2020-10-03 – 2020-10-04 (×2): 5 mg via ORAL
  Filled 2020-10-03 (×2): qty 1

## 2020-10-03 MED ORDER — METOPROLOL TARTRATE 50 MG PO TABS
75.0000 mg | ORAL_TABLET | Freq: Two times a day (BID) | ORAL | Status: DC
  Administered 2020-10-04: 75 mg via ORAL
  Filled 2020-10-03: qty 1

## 2020-10-03 MED ORDER — AMOXICILLIN 500 MG PO CAPS
500.0000 mg | ORAL_CAPSULE | Freq: Three times a day (TID) | ORAL | Status: DC
  Administered 2020-10-04 (×2): 500 mg via ORAL
  Filled 2020-10-03 (×4): qty 1

## 2020-10-03 MED ORDER — APIXABAN 2.5 MG PO TABS
2.5000 mg | ORAL_TABLET | Freq: Once | ORAL | Status: AC
  Administered 2020-10-03: 2.5 mg via ORAL
  Filled 2020-10-03: qty 1

## 2020-10-03 NOTE — Progress Notes (Signed)
Progress Note  Patient Name: Gweneth FritterHilda Tapia Date of Encounter: 10/03/2020   [facility] Cardiologist: Dr Donnamarie PoagFolp ([facility])  Dr Reuel Boomaniel [facility]  Subjective   Patient resting in bed earlier  Up now   Says she got some rest  Feeling some better     Inpatient Medications    Scheduled Meds: . apixaban  2.5 mg Oral BID  . Chlorhexidine Gluconate Cloth  6 each Topical Daily  . ezetimibe  10 mg Oral Daily  . insulin aspart  0-5 Units Subcutaneous QHS  . insulin aspart  0-9 Units Subcutaneous TID WC  . insulin detemir  10 Units Subcutaneous BID  . metoprolol tartrate  50 mg Oral BID  . pantoprazole  40 mg Oral Daily   Continuous Infusions: . sodium chloride    . cefTRIAXone (ROCEPHIN)  IV Stopped (10/02/20 2211)  . norepinephrine (LEVOPHED) Adult infusion Stopped (10/01/20 0623)   PRN Meds: acetaminophen **OR** acetaminophen, ipratropium-albuterol, lip balm, melatonin, traZODone   Vital Signs    Vitals:   10/02/20 0737 10/02/20 1148 10/02/20 1951 10/03/20 0505  BP: (!) 155/61 (!) 146/85 (!) 149/99 136/71  Pulse: (!) 105 92 89 96  Resp: 18 18 20 20   Temp: 98 F (36.7 C) 97.9 F (36.6 C) 97.9 F (36.6 C) 98.1 F (36.7 C)  TempSrc:  Oral Oral   SpO2: 97% 99% 100% 96%  Weight:      Height:        Intake/Output Summary (Last 24 hours) at 10/03/2020 [id number] Last data filed at 10/03/2020 0400 Gross per 24 hour  Intake 102.92 ml  Output 900 ml  Net -797.08 ml   Last 3 Weights 10/02/2020 10/01/2020 09/29/2020  Weight (lbs) 229 lb 8 oz 149 lb 14.6 oz 150 lb  Weight (kg) 104.1 kg 68 kg 68.04 kg      Telemetry     Afib with RVR 100s to   120- Personally Reviewed  ECG    -None new Personally Reviewed  Physical Exam   GEN: Obese 86 yo in acute distress.   Neck: Neck is full   Cardiac: Irreg irreg , no murmurs Respiratory: Rel clear bilaterally  GI: Soft, nontender, non-distended  MS: Tr LE  edema; No deformity. Neuro:  Nonfocal  Psych: Normal affect   Labs    High  Sensitivity Troponin:   Recent Labs  Lab 09/29/20 2011 09/30/20 0501 09/30/20 0747 09/30/20 1051 10/01/20 1017  TROPONINIHS 19* 54* 201* 720* 610*      Chemistry Recent Labs  Lab 09/29/20 1820 09/30/20 0501 09/30/20 0712 10/01/20 0100 10/02/20 0509 10/03/20 0346  NA 132* 136   < > 138 137 142  K 5.0 4.3   < > 4.3 4.3 4.1  CL 96* 98  --  105 102 102  CO2 23 25  --  25 28 34*  GLUCOSE 214* 206*  --  198* 207* 123*  BUN 25* 27*  --  37* 32* 22  CREATININE 1.24* 1.69*  --  1.76* 1.09* 0.84  CALCIUM 8.3* 8.6*  --  8.1* 8.6* 9.0  PROT 5.9* 6.1*  --   --   --   --   ALBUMIN 2.9* 2.9*  --   --   --   --   AST 25 19  --   --   --   --   ALT 15 20  --   --   --   --   ALKPHOS 99 94  --   --   --   --  BILITOT 3.4* 1.6*  --   --   --   --   GFRNONAA 43* 30*  --  28* 50* >60  ANIONGAP 13 13  --  8 7 6    < > = values in this interval not displayed.     Hematology Recent Labs  Lab 10/01/20 0100 10/02/20 0509 10/03/20 0346  WBC 19.9* 17.1* 11.0*  RBC 3.71* 3.73* 4.19  HGB 11.5* 11.8* 13.0  HCT 35.9* 36.5 40.9  MCV 96.8 97.9 97.6  MCH 31.0 31.6 31.0  MCHC 32.0 32.3 31.8  RDW 13.5 13.4 13.1  PLT 163 152 164    BNP Recent Labs  Lab 09/30/20 0453  BNP 384.7*     DDimer No results for input(s): DDIMER in the last 168 hours.   Radiology    DG Swallowing Func-Speech Pathology  Result Date: 10/01/2020 Objective Swallowing Evaluation: Type of Study: MBS-Modified Barium Swallow Study  Patient Details Name: Kaaliyah Kita MRN: Gweneth Fritter Date of Birth: 1935-06-04 Today's Date: 10/01/2020 Time: SLP Start Time (ACUTE ONLY): 1125 -SLP Stop Time (ACUTE ONLY): 1133 SLP Time Calculation (min) (ACUTE ONLY): 8 min Past Medical History: Past Medical History: Diagnosis Date . Atrial fibrillation (HCC)  . AV block  . CAD (coronary artery disease)  . Congestive heart failure (CHF) (HCC)  . COPD (chronic obstructive pulmonary disease) (HCC)  . Diabetes (HCC)  . GERD (gastroesophageal  reflux disease)  . Hypertension  . Left bundle branch block  . Localized edema  . TIA (transient ischemic attack)  . Vitamin D deficiency  Past Surgical History: Past Surgical History: Procedure Laterality Date . heart valve   . REPLACEMENT TOTAL KNEE BILATERAL   HPI: 86 year old female presenting from independent living with complaints of one day history of nausea, vomiting, weakness, and poor PO intake.  Appears to be recently seen for a pruritic rash on her upper chest (ongoing for 2 months) and recently treated for UTI with keflex on 3/4. Admitted for sepsis to [facility].  She developed further respiratory distress with increased work of breathing and encephalopathic and was placed on BiPAP and given lasix 40mg .  Follow up CXR questions new patchy right lung opacity, concerning for aspiration.  Subjective: Pt awake, alert, pleasant, participative Assessment / Plan / Recommendation CHL IP CLINICAL IMPRESSIONS 10/01/2020 Clinical Impression Pt presents with functional oropharyngeal swallowing.  There was transient penetration of thin liquid during pill simulation only.  There was brief oral stasis of tablet.  Pt used a head jerk to assist in transit of tablet.  This resulted in premature spillage of thin liquid wash into the laryngeal vestibule, which was fully cleared during swallow.  Pt denies that she has difficulty taking medications typically, and RN reported no difficulty with today's medications.  Should pt have difficulty swallowing pills, consider giving 1 at a time, or whole with puree.  Recommend regular texture diet with thin liquids.  Pt has no further ST needs at this time; SLP will sign off. SLP Visit Diagnosis Dysphagia, unspecified (R13.10) Attention and concentration deficit following -- Frontal lobe and executive function deficit following -- Impact on safety and function No limitations   CHL IP TREATMENT RECOMMENDATION 10/01/2020 Treatment Recommendations No treatment recommended at this time    Prognosis 10/01/2020 Prognosis for Safe Diet Advancement (No Data) Barriers to Reach Goals -- Barriers/Prognosis Comment -- CHL IP DIET RECOMMENDATION 10/01/2020 SLP Diet Recommendations Regular solids;Thin liquid Liquid Administration via Cup;Straw Medication Administration Whole meds with liquid Compensations -- Postural Changes --  CHL IP OTHER RECOMMENDATIONS 10/01/2020 Recommended Consults -- Oral Care Recommendations Oral care BID Other Recommendations --   CHL IP FOLLOW UP RECOMMENDATIONS 10/01/2020 Follow up Recommendations None   CHL IP FREQUENCY AND DURATION 10/01/2020 Speech Therapy Frequency (ACUTE ONLY) (No Data) Treatment Duration --      CHL IP ORAL PHASE 10/01/2020 Oral Phase WFL Oral - Pudding Teaspoon -- Oral - Pudding Cup -- Oral - Honey Teaspoon -- Oral - Honey Cup -- Oral - Nectar Teaspoon -- Oral - Nectar Cup -- Oral - Nectar Straw -- Oral - Thin Teaspoon -- Oral - Thin Cup WFL Oral - Thin Straw WFL Oral - Puree WFL Oral - Mech Soft -- Oral - Regular WFL Oral - Multi-Consistency -- Oral - Pill Reduced posterior propulsion Oral Phase - Comment --  CHL IP PHARYNGEAL PHASE 10/01/2020 Pharyngeal Phase WFL Pharyngeal- Pudding Teaspoon -- Pharyngeal -- Pharyngeal- Pudding Cup -- Pharyngeal -- Pharyngeal- Honey Teaspoon -- Pharyngeal -- Pharyngeal- Honey Cup -- Pharyngeal -- Pharyngeal- Nectar Teaspoon -- Pharyngeal -- Pharyngeal- Nectar Cup -- Pharyngeal -- Pharyngeal- Nectar Straw -- Pharyngeal -- Pharyngeal- Thin Teaspoon -- Pharyngeal -- Pharyngeal- Thin Cup [facility] Pharyngeal Material enters airway, remains ABOVE vocal cords then ejected out Pharyngeal- Thin Straw WFL Pharyngeal Material does not enter airway Pharyngeal- Puree WFL Pharyngeal Material does not enter airway Pharyngeal- Mechanical Soft -- Pharyngeal -- Pharyngeal- Regular WFL Pharyngeal Material does not enter airway Pharyngeal- Multi-consistency -- Pharyngeal -- Pharyngeal- Pill WFL Pharyngeal Material enters airway, remains ABOVE vocal  cords then ejected out Pharyngeal Comment --  CHL IP CERVICAL ESOPHAGEAL PHASE 10/01/2020 Cervical Esophageal Phase WFL Pudding Teaspoon -- Pudding Cup -- Honey Teaspoon -- Honey Cup -- Nectar Teaspoon -- Nectar Cup -- Nectar Straw -- Thin Teaspoon -- Thin Cup -- Thin Straw -- Puree -- Mechanical Soft -- Regular -- Multi-consistency -- Pill -- Cervical Esophageal Comment -- Leigh E Borum 10/01/2020, 11:59 AM               Cardiac Studies   Echo:   09/30/20  1. Left ventricular ejection fraction, by estimation, is 60 to 65%. The  left ventricle has normal function. The left ventricle has no regional  wall motion abnormalities. There is moderate concentric left ventricular  hypertrophy. Left ventricular  diastolic function could not be evaluated. Elevated left ventricular  end-diastolic pressure.  2. Right ventricular systolic function was not well visualized. The right  ventricular size is not well visualized.  3. Left atrial size was severely dilated.  4. Right atrial size was mildly dilated.  5. The mitral valve is rheumatic. Trivial mitral valve regurgitation.  Moderate mitral stenosis. The mean mitral valve gradient is 6.0 mmHg.  Moderate to severe mitral annular calcification.  6. The aortic valve has been repaired/replaced. Aortic valve  regurgitation not well visualized. There is a 23 mm Sapien prosthetic  (TAVR) valve present in the aortic position. Procedure Date: 06/03/20.  Aortic valve mean gradient measures 13.0 mmHg.  7. The inferior vena cava is dilated in size with <50% respiratory  variability, suggesting right atrial pressure of 15 mmHg.   Echo  08/15/20  SUMMARY Left ventricular systolic function is normal. LV ejection fraction = 65-70%. No segmental wall motion abnormalities seen in the left ventricle The right ventricle is normal in size and function. The left atrial size is normal. 13mm Sapien bioprosthetic valve in the aortic position. Mean pressure gradient  of 15 mmHg. There is no aortic regurgitation. There is mild aortic stenosis. There  is moderate mitral stenosis. Mean transmitral pressure gradient of 9 mmHg. The heart rate for the mean mitral valve gradient is 94 BPM. There is no significant change in comparison with the last study.  Cath 04/06/20  DIAGNOSTIC FINDINGS:   1. Severe aortic stenosis, mean gradient 47 mmHg. Aortic valve area 0.59  cm.  2. Normal cardiac output, 4.8 L/min (cardiac index 2.3).  3. Moderate pulmonary hypertension, 54/20 6 mmHg.  4. Elevated right atrial and left atrial filling pressures, consistent  with intravascular salt/volume overload.  5. Obstructive coronary artery disease involving the mid RCA and first  diagonal. Borderline mid LAD disease.   COMPLICATIONS: None   RECOMMENDATIONS:   Continue diuretic therapy, given the patient's significant salt/volume  overload.   A low-salt diet, antihypertensive therapy, and regular exercise are  strongly recommended, as these are the hallmarks of management of chronic  heart failure with preserved ejection fraction.   Given that the patient has no angina, revascularization of her coronary  arteries is not necessary at this time. We will manage her coronary  artery disease medically for now.   I will have a clinic appointment with the patient and family members  within the next few weeks to discuss management of her severe aortic  stenosis. Specifically, we will discuss whether she wishes to proceed  with a transcatheter aortic valve replacement versus a palliative care  approach.   Patient Profile      Vylette Strubel is a 86 y.o. female with a hx of aortic stenosis s/p TAVR 06/03/2020, mitral stenosis (rheumatic fever at 86yo), DM2, history of TIA, paroxysmal atrial fibrillation on chronic anticoagulation, borderline obstructive CAD with first diagonal lesion at 70% and a mid RCA lesion at 80% per cath 03/2020, HTN, HLD, carotid artery  stenosis with a known 70% stenosis of the LICA, pulmonary hypertension and former tobacco use who is being seen today for the evaluation of elevated troponin at the request of Dr. Everardo All.  Assessment & Plan    1   CAD/ Elevated troponin     Pt with troponin elevation earlier in admit  Peak 720   Most likely due to demand ischemia in setting of sepsis    She has no angina  2  PAF   Pt with hx of PAF   She is back in afib today   Interesting that she did not have earlier   Rates need to be better controlled   WIl incrase meds  Continue Eliquis    3  S/P TAVR  05/2020 [facility], Dr Reuel Boom)  23 mm Sapien bioprosthesis      4  Mod Mitral stenosis  REmains mod on this echo      4.  Sepsis  Felt due to UTI and aspiration neumonia    WBC continues to go down    As per IM  On ABX    4  Blood pressure    At home was on 100 Toprol XL; aldactone 25 bid; Dilt 120 daily  Furosemide 40 mg daily    Now on b blocker only   Dose increased this am    Would slowly titrate as BP allows       5   CVZ   70% LICA  Follows with vascular surgery  6  HL On Zetia  Intolerant to statins  Rx Zetia     Will follow with Dr Donnamarie Poag to discuss/ review  ? Repatha  For questions or updates, please contact [facility]  Please consult [email or web address] for contact info under        Signed, Dietrich Pates, MD  10/03/2020, 8:08 AM

## 2020-10-03 NOTE — Progress Notes (Signed)
PROGRESS NOTE    Brianna Lowe  ZOX:[id number] DOB: 1935-03-07 DOA: 09/29/2020 PCP: Pcp, No    Brief Narrative:  86 year old female who lives in an independent living facility at [facility], she has history of TAVR, paroxysmal A. fib, type 2 diabetes, mitral stenosis, history of coronary artery disease, recent left femur fracture who presented to the emergency room with progressive weakness for about 24 hours, nausea vomiting and episode of diarrhea.  She was recently treated with antibiotics for rash on her chest.  In the emergency room, she was febrile with temperature 103.  Initially on 2 L oxygen, chest x-ray showing right lower lobe pneumonia.  Lactic acid was 2.  WBC count was 17.  UA was abnormal.  Patient was started on antibiotics with sepsis protocol, she became more short of breath needing heated high flow oxygen and was admitted to ICU. 4/14, admission to medical floor for sepsis and fever, UTI and possible aspiration pneumonia, developed progressive respiratory distress on BiPAP, started on vancomycin and ceftriaxone azithromycin and admitted to ICU.  Also transiently needed on vasopressor. 4/16, transferred to medical floor on 4 L of oxygen. 418, on room air.  Still feels dizzy and just does not feel well.  Assessment & Plan:   Principal Problem:   Severe sepsis (HCC) Active Problems:   Acute febrile illness   SIRS (systemic inflammatory response syndrome) (HCC)   ARF (acute renal failure) (HCC)   PAF (paroxysmal atrial fibrillation) (HCC)   Acute respiratory failure with hypoxia and hypercapnia (HCC)  Sepsis present on admission secondary to E. coli bacteremia/aspiration pneumonia: Acute hypoxemic respiratory failure. Urine culture, 50,000 colonies of E. coli. Blood cultures, pansensitive E. Coli. Chest x-ray with right lower lobe pneumonia Initially treated with broad-spectrum antibiotics, now remains on ceftriaxone.  Change ceftriaxone to cefazolin for the narrow  spectrum.  We will continue IV until she remains in the hospital and changed to oral antibiotics when ready to discharge. Seen by speech, on regular diet.  All-time aspiration precautions.  Keep on oxygen to keep saturations more than 90%.  Breathing treatments, chest physiotherapy, bronchodilator therapy, mobility today. CT scan of the abdomen pelvis 4/14 with no evidence of intra-abdominal pathology, no urinary retention.  Acute kidney injury: Resolved.  Intermittently diuresed.  Followed by cardiology.  Paroxysmal atrial fibrillation with RVR: All rate control medications were discontinued due to low blood pressure.   Metoprolol 25 mg twice a day on 4/16 Metoprolol 50 mg twice a day started on 4/17, patient is tolerating.  Heart rate control is acceptable.  Not yet started on home dose of diltiazem.  Blood pressures are adequate on supine position, complains of being dizzy.  Will check orthostatic with physical therapy. Increase Eliquis to full dose Eliquis with improvement in renal function.  Demand ischemia in a patient with history of coronary artery disease, aortic stenosis status post TAVR: Troponin peaked to 720.  Followed by cardiology.  Recommended conservative management.  Currently remains on beta-blockers, statin, therapeutic on Eliquis.  Essential hypertension: Blood pressures are adequate today.  Check orthostatic with mobility.  Type 2 diabetes: Uncontrolled.  Patient on glipizide and insulin at home.  Currently remains on reduced dose of insulin due to poor oral intake.  Today adequate.  Physical debility: Start working with PT OT today.  Anticipate discharge to skilled nursing facility.  Start mobilizing.  Day and night cycle regulation.  PT OT today.   Melatonin at night.     DVT prophylaxis: apixaban (ELIQUIS) tablet 2.5  mg Start: 10/03/20 1000 apixaban (ELIQUIS) tablet 2.5 mg  apixaban (ELIQUIS) tablet 5 mg   Code Status: Full code Family Communication: Daughter at  the bedside 4/17.  None today. Disposition Plan: Status is: Inpatient  Remains inpatient appropriate because:IV treatments appropriate due to intensity of illness or inability to take PO and Inpatient level of care appropriate due to severity of illness   Dispo: The patient is from: Home              Anticipated d/c is to: SNF              Patient currently is not medically stable to d/c.   Difficult to place patient No         Consultants:   Cardiology  PCCM  Procedures:   None  Antimicrobials:  Antibiotics Given (last 72 hours)    Date/Time Action Medication Dose Rate   09/30/20 1951 New Bag/Given   cefTRIAXone (ROCEPHIN) 2 g in sodium chloride 0.9 % 100 mL IVPB 2 g 200 mL/hr   09/30/20 2023 New Bag/Given   azithromycin (ZITHROMAX) 500 mg in sodium chloride 0.9 % 250 mL IVPB 500 mg 250 mL/hr   10/01/20 2029 New Bag/Given   cefTRIAXone (ROCEPHIN) 2 g in sodium chloride 0.9 % 100 mL IVPB 2 g 200 mL/hr   10/02/20 2127 New Bag/Given   cefTRIAXone (ROCEPHIN) 2 g in sodium chloride 0.9 % 100 mL IVPB 2 g 200 mL/hr         Subjective: Patient seen and examined.  She feels tired and dizzy.  She tells me she just does not feel well.  Denies any nausea vomiting.  Denies any chest pain or palpitations.  Denies any abdominal pain.  Looks anxious. Received trazodone last night and she did get some sleep.  Objective: Vitals:   10/02/20 0737 10/02/20 1148 10/02/20 1951 10/03/20 0505  BP: (!) 155/61 (!) 146/85 (!) 149/99 136/71  Pulse: (!) 105 92 89 96  Resp: Temp: 98 F (36.7 C) 97.9 F (36.6 C) 97.9 F (36.6 C) 98.1 F (36.7 C)  TempSrc:  Oral Oral   SpO2: 97% 99% 100% 96%  Weight:      Height:        Intake/Output Summary (Last 24 hours) at 10/03/2020 0925 Last data filed at 10/03/2020 0400 Gross per 24 hour  Intake 102.92 ml  Output 900 ml  Net -797.08 ml   Filed Weights   09/29/20 1813 10/01/20 0439 10/02/20 0405  Weight: 68 kg 68 kg 104.1 kg     Examination:  General exam: Appears anxious.  Frail and debilitated.  On room air now. Respiratory system: No added sounds.  On room air. Cardiovascular system: S1 & S2 heard, irregularly irregular.  No edema.   Gastrointestinal system: Abdomen is nondistended, soft and nontender. No organomegaly or masses felt. Normal bowel sounds heard. Central nervous system: Alert and oriented. No focal neurological deficits.  Generalized weakness. Extremities: Symmetric 5 x 5 power.     Data Reviewed: I have personally reviewed following labs and imaging studies  CBC: Recent Labs  Lab 09/29/20 1820 09/30/20 0501 09/30/20 0712 09/30/20 0848 10/01/20 0100 10/02/20 0509 10/03/20 0346  WBC 17.3* 18.6*  --   --  19.9* 17.1* 11.0*  NEUTROABS 15.7*  --   --   --   --   --   --   HGB 13.9 13.6 14.3 12.9 11.5* 11.8* 13.0  HCT 42.9 42.2 42.0 38.0  35.9* 36.5 40.9  MCV 95.8 98.4  --   --  96.8 97.9 97.6  PLT 200 201  --   --  163 152 164   Basic Metabolic Panel: Recent Labs  Lab 09/29/20 1820 09/30/20 0501 09/30/20 0712 09/30/20 0848 09/30/20 1051 10/01/20 0100 10/02/20 0509 10/03/20 0346  NA 132* 136 135 136  --  138 137 142  K 5.0 4.3 4.1 4.1  --  4.3 4.3 4.1  CL 96* 98  --   --   --  105 102 102  CO2 23 25  --   --   --  25 28 34*  GLUCOSE 214* 206*  --   --   --  198* 207* 123*  BUN 25* 27*  --   --   --  37* 32* 22  CREATININE 1.24* 1.69*  --   --   --  1.76* 1.09* 0.84  CALCIUM 8.3* 8.6*  --   --   --  8.1* 8.6* 9.0  MG  --   --   --   --  1.7  --   --  2.0  PHOS  --   --   --   --   --   --   --  3.1   GFR: Estimated Creatinine Clearance: 59.7 mL/min (by C-G formula based on SCr of 0.84 mg/dL). Liver Function Tests: Recent Labs  Lab 09/29/20 1820 09/30/20 0501  AST 25 19  ALT 15 20  ALKPHOS 99 94  BILITOT 3.4* 1.6*  PROT 5.9* 6.1*  ALBUMIN 2.9* 2.9*   Recent Labs  Lab 09/29/20 1820  LIPASE 26   No results for input(s): AMMONIA in the last 168  hours. Coagulation Profile: Recent Labs  Lab 09/30/20 1051  INR 1.8*   Cardiac Enzymes: No results for input(s): CKTOTAL, CKMB, CKMBINDEX, TROPONINI in the last 168 hours. BNP (last 3 results) No results for input(s): PROBNP in the last 8760 hours. HbA1C: No results for input(s): HGBA1C in the last 72 hours. CBG: Recent Labs  Lab 10/02/20 1150 10/02/20 1558 10/02/20 1947 10/02/20 2308 10/03/20 0812  GLUCAP 226* 165* 251* 169* 148*   Lipid Profile: No results for input(s): CHOL, HDL, LDLCALC, TRIG, CHOLHDL, LDLDIRECT in the last 72 hours. Thyroid Function Tests: No results for input(s): TSH, T4TOTAL, FREET4, T3FREE, THYROIDAB in the last 72 hours. Anemia Panel: No results for input(s): VITAMINB12, FOLATE, FERRITIN, TIBC, IRON, RETICCTPCT in the last 72 hours. Sepsis Labs: Recent Labs  Lab 09/29/20 2023 09/30/20 0452  LATICACIDVEN 2.0* 1.8    Recent Results (from the past 240 hour(s))  Resp Panel by RT-PCR (Flu A&B, Covid) Nasopharyngeal Swab     Status: None   Collection Time: 09/29/20  7:22 PM   Specimen: Nasopharyngeal Swab; Nasopharyngeal(NP) swabs in vial transport medium  Result Value Ref Range Status   SARS Coronavirus 2 by RT PCR NEGATIVE NEGATIVE Final    Comment: (NOTE) SARS-CoV-2 target nucleic acids are NOT DETECTED.  The SARS-CoV-2 RNA is generally detectable in upper respiratory specimens during the acute phase of infection. The lowest concentration of SARS-CoV-2 viral copies this assay can detect is 138 copies/mL. A negative result does not preclude SARS-Cov-2 infection and should not be used as the sole basis for treatment or other patient management decisions. A negative result may occur with  improper specimen collection/handling, submission of specimen other than nasopharyngeal swab, presence of viral mutation(s) within the areas targeted by this assay, and inadequate number of viral  copies(<138 copies/mL). A negative result must be combined  with clinical observations, patient history, and epidemiological information. The expected result is Negative.  Fact Sheet for Patients:  BloggerCourse.comhttps://www.fda.gov/media/152166/download  Fact Sheet for Healthcare Providers:  SeriousBroker.ithttps://www.fda.gov/media/152162/download  This test is no t yet approved or cleared by the [place] FDA and  has been authorized for detection and/or diagnosis of SARS-CoV-2 by FDA under an Emergency Use Authorization (EUA). This EUA will remain  in effect (meaning this test can be used) for the duration of the COVID-19 declaration under Section 564(b)(1) of the Act, 21 U.S.C.section 360bbb-3(b)(1), unless the authorization is terminated  or revoked sooner.       Influenza A by PCR NEGATIVE NEGATIVE Final   Influenza B by PCR NEGATIVE NEGATIVE Final    Comment: (NOTE) The Xpert Xpress SARS-CoV-2/FLU/RSV plus assay is intended as an aid in the diagnosis of influenza from Nasopharyngeal swab specimens and should not be used as a sole basis for treatment. Nasal washings and aspirates are unacceptable for Xpert Xpress SARS-CoV-2/FLU/RSV testing.  Fact Sheet for Patients: BloggerCourse.comhttps://www.fda.gov/media/152166/download  Fact Sheet for Healthcare Providers: SeriousBroker.ithttps://www.fda.gov/media/152162/download  This test is not yet approved or cleared by the [place] FDA and has been authorized for detection and/or diagnosis of SARS-CoV-2 by FDA under an Emergency Use Authorization (EUA). This EUA will remain in effect (meaning this test can be used) for the duration of the COVID-19 declaration under Section 564(b)(1) of the Act, 21 U.S.C. section 360bbb-3(b)(1), unless the authorization is terminated or revoked.  Performed at [facility], [address] [place], [place] [address]   Culture, blood (routine x 2)     Status: Abnormal   Collection Time: 09/29/20  9:10 PM   Specimen: BLOOD  Result Value Ref Range Status   Specimen Description BLOOD RIGHT  ANTECUBITAL  Final   Special Requests   Final    BOTTLES DRAWN AEROBIC AND ANAEROBIC Blood Culture adequate volume   Culture  Setup Time   Final    GRAM NEGATIVE RODS IN BOTH AEROBIC AND ANAEROBIC BOTTLES PHARMD E.WOLFE AT 1256 ON 09/30/2020 BY T.SAAD Performed at [facility], [address] [place], [place] [address]    Culture ESCHERICHIA COLI (A)  Final   Report Status 10/02/2020 FINAL  Final   Organism ID, Bacteria ESCHERICHIA COLI  Final      Susceptibility   Escherichia coli - MIC*    AMPICILLIN <=2 SENSITIVE Sensitive     CEFAZOLIN <=4 SENSITIVE Sensitive     CEFEPIME <=0.12 SENSITIVE Sensitive     CEFTAZIDIME <=1 SENSITIVE Sensitive     CEFTRIAXONE <=0.25 SENSITIVE Sensitive     CIPROFLOXACIN 0.5 SENSITIVE Sensitive     GENTAMICIN <=1 SENSITIVE Sensitive     IMIPENEM <=0.25 SENSITIVE Sensitive     TRIMETH/SULFA <=20 SENSITIVE Sensitive     AMPICILLIN/SULBACTAM <=2 SENSITIVE Sensitive     PIP/TAZO <=4 SENSITIVE Sensitive     * ESCHERICHIA COLI  Blood Culture ID Panel (Reflexed)     Status: Abnormal   Collection Time: 09/29/20  9:10 PM  Result Value Ref Range Status   Enterococcus faecalis NOT DETECTED NOT DETECTED Final   Enterococcus Faecium NOT DETECTED NOT DETECTED Final   Listeria monocytogenes NOT DETECTED NOT DETECTED Final   Staphylococcus species NOT DETECTED NOT DETECTED Final   Staphylococcus aureus (BCID) NOT DETECTED NOT DETECTED Final   Staphylococcus epidermidis NOT DETECTED NOT DETECTED Final   Staphylococcus lugdunensis NOT DETECTED NOT DETECTED Final   Streptococcus  species NOT DETECTED NOT DETECTED Final   Streptococcus agalactiae NOT DETECTED NOT DETECTED Final   Streptococcus pneumoniae NOT DETECTED NOT DETECTED Final   Streptococcus pyogenes NOT DETECTED NOT DETECTED Final   A.calcoaceticus-baumannii NOT DETECTED NOT DETECTED Final   Bacteroides fragilis NOT DETECTED NOT DETECTED Final   Enterobacterales DETECTED (A) NOT DETECTED Final     Comment: Enterobacterales represent a large order of gram negative bacteria, not a single organism. CRITICAL RESULT CALLED TO, READ BACK BY AND VERIFIED WITH: PHARMD E.WOLFE AT 1256 ON 09/30/2020 BY T.SAAD.    Enterobacter cloacae complex NOT DETECTED NOT DETECTED Final   Escherichia coli DETECTED (A) NOT DETECTED Final    Comment: CRITICAL RESULT CALLED TO, READ BACK BY AND VERIFIED WITH: PHARMD E.WOLFE AT 1256 ON 09/30/2020 BY T.SAAD.    Klebsiella aerogenes NOT DETECTED NOT DETECTED Final   Klebsiella oxytoca NOT DETECTED NOT DETECTED Final   Klebsiella pneumoniae NOT DETECTED NOT DETECTED Final   Proteus species NOT DETECTED NOT DETECTED Final   Salmonella species NOT DETECTED NOT DETECTED Final   Serratia marcescens NOT DETECTED NOT DETECTED Final   Haemophilus influenzae NOT DETECTED NOT DETECTED Final   Neisseria meningitidis NOT DETECTED NOT DETECTED Final   Pseudomonas aeruginosa NOT DETECTED NOT DETECTED Final   Stenotrophomonas maltophilia NOT DETECTED NOT DETECTED Final   Candida albicans NOT DETECTED NOT DETECTED Final   Candida auris NOT DETECTED NOT DETECTED Final   Candida glabrata NOT DETECTED NOT DETECTED Final   Candida krusei NOT DETECTED NOT DETECTED Final   Candida parapsilosis NOT DETECTED NOT DETECTED Final   Candida tropicalis NOT DETECTED NOT DETECTED Final   Cryptococcus neoformans/gattii NOT DETECTED NOT DETECTED Final   CTX-M ESBL NOT DETECTED NOT DETECTED Final   Carbapenem resistance IMP NOT DETECTED NOT DETECTED Final   Carbapenem resistance KPC NOT DETECTED NOT DETECTED Final   Carbapenem resistance NDM NOT DETECTED NOT DETECTED Final   Carbapenem resist OXA 48 LIKE NOT DETECTED NOT DETECTED Final   Carbapenem resistance VIM NOT DETECTED NOT DETECTED Final    Comment: Performed at [facility], [address], [place], [place] [address]  Culture, blood (routine x 2)     Status: None (Preliminary result)   Collection Time: 09/29/20  9:17 PM    Specimen: BLOOD LEFT HAND  Result Value Ref Range Status   Specimen Description BLOOD LEFT HAND  Final   Special Requests   Final    BOTTLES DRAWN AEROBIC AND ANAEROBIC Blood Culture results may not be optimal due to an inadequate volume of blood received in culture bottles   Culture   Final    NO GROWTH 4 DAYS Performed at [facility], [address], [place], [place] [address]    Report Status PENDING  Incomplete  Culture, Urine     Status: Abnormal   Collection Time: 09/29/20  9:56 PM   Specimen: Urine, Random  Result Value Ref Range Status   Specimen Description URINE, RANDOM  Final   Special Requests   Final    NONE Performed at [facility], [address], [place], [place] [address]    Culture 50,000 COLONIES/mL ESCHERICHIA COLI (A)  Final   Report Status 10/03/2020 FINAL  Final   Organism ID, Bacteria ESCHERICHIA COLI (A)  Final      Susceptibility   Escherichia coli - MIC*    AMPICILLIN <=2 SENSITIVE Sensitive     CEFAZOLIN <=4 SENSITIVE Sensitive     CEFEPIME <=  0.12 SENSITIVE Sensitive     CEFTRIAXONE <=0.25 SENSITIVE Sensitive     CIPROFLOXACIN 0.5 SENSITIVE Sensitive     GENTAMICIN <=1 SENSITIVE Sensitive     IMIPENEM <=0.25 SENSITIVE Sensitive     NITROFURANTOIN <=16 SENSITIVE Sensitive     TRIMETH/SULFA <=20 SENSITIVE Sensitive     AMPICILLIN/SULBACTAM <=2 SENSITIVE Sensitive     PIP/TAZO <=4 SENSITIVE Sensitive     * 50,000 COLONIES/mL ESCHERICHIA COLI  MRSA PCR Screening     Status: None   Collection Time: 09/30/20 10:00 AM   Specimen: Nasopharyngeal  Result Value Ref Range Status   MRSA by PCR NEGATIVE NEGATIVE Final    Comment:        The GeneXpert MRSA Assay (FDA approved for NASAL specimens only), is one component of a comprehensive MRSA colonization surveillance program. It is not intended to diagnose MRSA infection nor to guide or monitor treatment for MRSA infections. Performed at [facility], [address],  [place], [place] [address]          Radiology Studies: DG Swallowing [facility] Pathology  Result Date: 10/01/2020 Objective Swallowing Evaluation: Type of Study: MBS-Modified Barium Swallow Study  Patient Details Name: Brianna Lowe MRN: [id number] Date of Birth: 1934/09/22 Today's Date: 10/01/2020 Time: SLP Start Time (ACUTE ONLY): 1125 -SLP Stop Time (ACUTE ONLY): 1133 SLP Time Calculation (min) (ACUTE ONLY): 8 min Past Medical History: Past Medical History: Diagnosis Date . Atrial fibrillation (HCC)  . AV block  . CAD (coronary artery disease)  . Congestive heart failure (CHF) (HCC)  . COPD (chronic obstructive pulmonary disease) (HCC)  . Diabetes (HCC)  . GERD (gastroesophageal reflux disease)  . Hypertension  . Left bundle branch block  . Localized edema  . TIA (transient ischemic attack)  . Vitamin D deficiency  Past Surgical History: Past Surgical History: Procedure Laterality Date . heart valve   . REPLACEMENT TOTAL KNEE BILATERAL   HPI: 86 year old female presenting from independent living with complaints of one day history of nausea, vomiting, weakness, and poor PO intake.  Appears to be recently seen for a pruritic rash on her upper chest (ongoing for 2 months) and recently treated for UTI with keflex on 3/4. Admitted for sepsis to [facility].  She developed further respiratory distress with increased work of breathing and encephalopathic and was placed on BiPAP and given lasix 40mg .  Follow up CXR questions new patchy right lung opacity, concerning for aspiration.  Subjective: Pt awake, alert, pleasant, participative Assessment / Plan / Recommendation CHL IP CLINICAL IMPRESSIONS 10/01/2020 Clinical Impression Pt presents with functional oropharyngeal swallowing.  There was transient penetration of thin liquid during pill simulation only.  There was brief oral stasis of tablet.  Pt used a head jerk to assist in transit of tablet.  This resulted in premature spillage of thin liquid wash into the laryngeal  vestibule, which was fully cleared during swallow.  Pt denies that she has difficulty taking medications typically, and RN reported no difficulty with today's medications.  Should pt have difficulty swallowing pills, consider giving 1 at a time, or whole with puree.  Recommend regular texture diet with thin liquids.  Pt has no further ST needs at this time; SLP will sign off. SLP Visit Diagnosis Dysphagia, unspecified (R13.10) Attention and concentration deficit following -- Frontal lobe and executive function deficit following -- Impact on safety and function No limitations   CHL IP TREATMENT RECOMMENDATION 10/01/2020 Treatment Recommendations No treatment recommended at this time  Prognosis 10/01/2020 Prognosis for Safe Diet Advancement (No Data) Barriers to Reach Goals -- Barriers/Prognosis Comment -- CHL IP DIET RECOMMENDATION 10/01/2020 SLP Diet Recommendations Regular solids;Thin liquid Liquid Administration via Cup;Straw Medication Administration Whole meds with liquid Compensations -- Postural Changes --   CHL IP OTHER RECOMMENDATIONS 10/01/2020 Recommended Consults -- Oral Care Recommendations Oral care BID Other Recommendations --   CHL IP FOLLOW UP RECOMMENDATIONS 10/01/2020 Follow up Recommendations None   CHL IP FREQUENCY AND DURATION 10/01/2020 Speech Therapy Frequency (ACUTE ONLY) (No Data) Treatment Duration --      CHL IP ORAL PHASE 10/01/2020 Oral Phase WFL Oral - Pudding Teaspoon -- Oral - Pudding Cup -- Oral - Honey Teaspoon -- Oral - Honey Cup -- Oral - Nectar Teaspoon -- Oral - Nectar Cup -- Oral - Nectar Straw -- Oral - Thin Teaspoon -- Oral - Thin Cup WFL Oral - Thin Straw WFL Oral - Puree WFL Oral - Mech Soft -- Oral - Regular WFL Oral - Multi-Consistency -- Oral - Pill Reduced posterior propulsion Oral Phase - Comment --  CHL IP PHARYNGEAL PHASE 10/01/2020 Pharyngeal Phase WFL Pharyngeal- Pudding Teaspoon -- Pharyngeal -- Pharyngeal- Pudding Cup -- Pharyngeal -- Pharyngeal- Honey Teaspoon --  Pharyngeal -- Pharyngeal- Honey Cup -- Pharyngeal -- Pharyngeal- Nectar Teaspoon -- Pharyngeal -- Pharyngeal- Nectar Cup -- Pharyngeal -- Pharyngeal- Nectar Straw -- Pharyngeal -- Pharyngeal- Thin Teaspoon -- Pharyngeal -- Pharyngeal- Thin Cup [facility] Pharyngeal Material enters airway, remains ABOVE vocal cords then ejected out Pharyngeal- Thin Straw WFL Pharyngeal Material does not enter airway Pharyngeal- Puree WFL Pharyngeal Material does not enter airway Pharyngeal- Mechanical Soft -- Pharyngeal -- Pharyngeal- Regular WFL Pharyngeal Material does not enter airway Pharyngeal- Multi-consistency -- Pharyngeal -- Pharyngeal- Pill WFL Pharyngeal Material enters airway, remains ABOVE vocal cords then ejected out Pharyngeal Comment --  CHL IP CERVICAL ESOPHAGEAL PHASE 10/01/2020 Cervical Esophageal Phase WFL Pudding Teaspoon -- Pudding Cup -- Honey Teaspoon -- Honey Cup -- Nectar Teaspoon -- Nectar Cup -- Nectar Straw -- Thin Teaspoon -- Thin Cup -- Thin Straw -- Puree -- Mechanical Soft -- Regular -- Multi-consistency -- Pill -- Cervical Esophageal Comment -- Leigh E Borum 10/01/2020, 11:59 AM                   Scheduled Meds: . apixaban  2.5 mg Oral Once  . apixaban  5 mg Oral BID  . Chlorhexidine Gluconate Cloth  6 each Topical Daily  . ezetimibe  10 mg Oral Daily  . insulin aspart  0-5 Units Subcutaneous QHS  . insulin aspart  0-9 Units Subcutaneous TID WC  . insulin detemir  10 Units Subcutaneous BID  . metoprolol tartrate  50 mg Oral BID  . pantoprazole  40 mg Oral Daily   Continuous Infusions: . sodium chloride    .  ceFAZolin (ANCEF) IV    . norepinephrine (LEVOPHED) Adult infusion Stopped (10/01/20 6578)     LOS: 3 days    Time spent: 35 minutes    Dorcas Carrow, MD [facility] Pager [phone]

## 2020-10-03 NOTE — Plan of Care (Signed)

## 2020-10-03 NOTE — Evaluation (Signed)
Physical Therapy Evaluation Patient Details Name: Brianna Lowe MRN: [id number] DOB: Mar 30, 1935 Today's Date: 10/03/2020   History of Present Illness  Pt is an 86 yo female s/p nausea, vomiting, and diarrhead, UTI, PNA and SOB requiring O2. PMHx: Afib, T2DM, H/O CAD, L femur fx, Dementia.  Clinical Impression   Pt admitted secondary to problem above with deficits below. PTA patient was living in [place] with an aide assisting her several hours per day (per pt--of note, pt remains somewhat confused with ?accuracy).  Pt currently requires mod assist for bed mobility (however pt lethargic initial part of session) and min assist to transfer and walk a short distance. Hopeful as she cognitively clears she will be able to return to ILF level of care. If she does not, then would recommend SNF.  Will continue to follow acutely to maximize functional mobility independence and safety.        Follow Up Recommendations Home health PT (If cognition and mobility improve)    Equipment Recommendations  None recommended by PT    Recommendations for Other Services       Precautions / Restrictions Precautions Precautions: Fall;Other (comment) Precaution Comments: watch O2 Restrictions Weight Bearing Restrictions: No      Mobility  Bed Mobility Overal bed mobility: Needs Assistance Bed Mobility: Supine to Sit     Supine to sit: Mod assist;+2 for physical assistance;+2 for safety/equipment     General bed mobility comments: Assist for trunk and BLEs    Transfers Overall transfer level: Needs assistance Equipment used: Rolling walker (2 wheeled) Transfers: Sit to/from [facility] Sit to Stand: Min assist;+2 safety/equipment Stand pivot transfers: Min assist       General transfer comment: MinA for stability and for stability as pt was very lethargic.  Ambulation/Gait Ambulation/Gait assistance: Min assist;+2 safety/equipment Gait Distance (Feet): 3 Feet Assistive  device: Rolling walker (2 wheeled) Gait Pattern/deviations: Shuffle Gait velocity: decr   General Gait Details: pt initially reluctant to attempt ambulation and agreed to walk to chair. States she is eager to get better and return home, however frequently tries to avoid tasks requested of her  Stairs            Wheelchair Mobility    Modified Rankin (Stroke Patients Only)       Balance Overall balance assessment: Needs assistance Sitting-balance support: Bilateral upper extremity supported Sitting balance-Leahy Scale: Fair Sitting balance - Comments: fair balance   Standing balance support: Bilateral upper extremity supported Standing balance-Leahy Scale: Poor Standing balance comment: reliant on RW                             Pertinent Vitals/Pain Pain Assessment: Faces Faces Pain Scale: Hurts a little bit Pain Location: generalized Pain Descriptors / Indicators: Discomfort;Grimacing Pain Intervention(s): Limited activity within patient's tolerance;Monitored during session;Repositioned    Home Living Family/patient expects to be discharged to:: Private residence Living Arrangements: Alone Available Help at Discharge: Personal care attendant Type of Home: Independent living facility         Home Equipment: Gilford Rile - 2 wheels Additional Comments: ILF with caregiver daily for "several hours" for bathe/dress    Prior Function Level of Independence: Needs assistance   Gait / Transfers Assistance Needed: use of RW for stability  ADL's / Homemaking Assistance Needed: Pt reports assist with bathe/dress. Pt independent with grooming, eating. Pt's meals provided for her at facility.        Hand Dominance  Dominant Hand: Right    Extremity/Trunk Assessment   Upper Extremity Assessment Upper Extremity Assessment: Defer to OT evaluation    Lower Extremity Assessment Lower Extremity Assessment: Generalized weakness    Cervical / Trunk  Assessment Cervical / Trunk Assessment: Kyphotic  Communication   Communication: No difficulties  Cognition Arousal/Alertness: Lethargic Behavior During Therapy: WFL for tasks assessed/performed Overall Cognitive Status: No family/caregiver present to determine baseline cognitive functioning                                 General Comments: Pt talking with son on phone and was able to recall reason for being in hospital as UTI. Pt woke up as time went on. Pt was very lethargic upon arrival. Pt following commands and eventually perked up.      General Comments General comments (skin integrity, edema, etc.): 2L O2 at ret 100% O2; o2 on RA 92% after exertion; pt left on 2L to conclude session.    Exercises     Assessment/Plan    PT Assessment Patient needs continued PT services  PT Problem List Decreased strength;Decreased activity tolerance;Decreased balance;Decreased mobility;Decreased cognition;Decreased knowledge of use of DME;Decreased safety awareness;Decreased knowledge of precautions;Cardiopulmonary status limiting activity       PT Treatment Interventions DME instruction;Gait training;Functional mobility training;Therapeutic activities;Therapeutic exercise;Balance training;Cognitive remediation;Patient/family education    PT Goals (Current goals can be found in the Care Plan section)  Acute Rehab PT Goals Patient Stated Goal: to go home PT Goal Formulation: With patient Time For Goal Achievement: 10/17/20 Potential to Achieve Goals: Good    Frequency Min 3X/week   Barriers to discharge Decreased caregiver support aide only assists for "several hours" per day; ?could afford/receive more hours    Co-evaluation PT/OT/SLP Co-Evaluation/Treatment: Yes Reason for Co-Treatment: Necessary to address cognition/behavior during functional activity;For patient/therapist safety PT goals addressed during session: Mobility/safety with mobility;Balance;Proper use of  DME;Strengthening/ROM OT goals addressed during session: ADL's and self-care       AM-PAC PT "6 Clicks" Mobility  Outcome Measure Help needed turning from your back to your side while in a flat bed without using bedrails?: A Lot Help needed moving from lying on your back to sitting on the side of a flat bed without using bedrails?: A Lot Help needed moving to and from a bed to a chair (including a wheelchair)?: Total Help needed standing up from a chair using your arms (e.g., wheelchair or bedside chair)?: Total Help needed to walk in hospital room?: Total Help needed climbing 3-5 steps with a railing? : Total 6 Click Score: 8    End of Session Equipment Utilized During Treatment: Gait belt;Oxygen Activity Tolerance: Patient limited by fatigue Patient left: in chair;with call bell/phone within reach;with chair alarm set Nurse Communication: Mobility status PT Visit Diagnosis: Muscle weakness (generalized) (M62.81);Difficulty in walking, not elsewhere classified (R26.2)    Time: 1030-1056 PT Time Calculation (min) (ACUTE ONLY): 26 min   Charges:   PT Evaluation $PT Eval Moderate Complexity: 1 Mod           Arby Barrette, PT Pager [phone]   Rexanne Mano 10/03/2020, 1:49 PM

## 2020-10-03 NOTE — Consult Note (Signed)
Date of Admission:  09/29/2020          Reason for Consult: E. coli bacteremia from urinary source   Referring Provider: Dorcas CarrowKuber Ghimire, MD   Assessment:  1. E. Coli bacteremia from urinary source with sepsis 2. Hx of toxemic respiratory failure 3. History of TAVR 4. Rheumatic mitral valve 5. Paroxysmal atrial fibrillation 6. Diabetes mellitus 7. Delrium  Plan:  1. Agree with narrowing to cefazolin and tomorrow would change her to Amoxicillin 500mg  po TID to complete 7 days total 2. I will have her see me in the clinic where we will check surveillance blood cultures 2+ weeks after she finishes course here   Brianna FritterHilda Appelbaum has an appointment on 10/24/2020 at 10AM with Dr. Daiva EvesVan Dam  She should arrive 15 to 30 minutes prior to her appointment.  [facility] is located in the [facility] at  [address] in [place].  [address], which is located to the left of the elevators.  Phone: [phone]  Fax: [phone]  [email or web address]     Principal Problem:   Severe sepsis (HCC) Active Problems:   Acute febrile illness   SIRS (systemic inflammatory response syndrome) (HCC)   ARF (acute renal failure) (HCC)   PAF (paroxysmal atrial fibrillation) (HCC)   Acute respiratory failure with hypoxia and hypercapnia (HCC)   Scheduled Meds: . apixaban  5 mg Oral BID  . Chlorhexidine Gluconate Cloth  6 each Topical Daily  . ezetimibe  10 mg Oral Daily  . insulin aspart  0-5 Units Subcutaneous QHS  . insulin aspart  0-9 Units Subcutaneous TID WC  . insulin detemir  10 Units Subcutaneous BID  . metoprolol tartrate  50 mg Oral BID  . pantoprazole  40 mg Oral Daily   Continuous Infusions: . sodium chloride    .  ceFAZolin (ANCEF) IV 2 g (10/03/20 1423)   PRN Meds:.acetaminophen **OR** acetaminophen, ipratropium-albuterol, lip balm, melatonin  HPI: Brianna FritterHilda Knabe is a 86 y.o. female who has  been living at [facility] assisted living with past medical history significant for rheumatic heart disease with mitral stenosis, severe aortic stenosis status post TAVR paroxysmal atrial fibrillation, coronary artery disease recent left femur fracture who came to the emergency department with progressive weakness nausea vomiting.  She had apparently just been treated with antibiotics for a rash on her chest.  The ER she was febrile to over 103 degrees.  She had pyuria and elevated lactic acid chest x-ray showed possible infiltrate on the right side that was a portable film that was somewhat suboptimal.  Blood cultures were taken urine cultures were taken she was started on broad-spectrum antibiotics and admitted to hospitalist service.  She had progressive respiratory distress on BiPAP and was transferred to the intensive care unit.  She transiently required some vasopressor support stabilized and was transferred back to the medical floor now with 4 L of oxygen via nasal cannula.  In the interim 1/2 blood cultures  and urine cultures have both grown fairly pansensitive E. Coli.  Has been narrowed to ceftriaxone and now cefazolin.  Is currently on her fifth day of antibiotics and will switch her over to amoxicillin tomorrow to complete 7 days total.  I will have her come to my clinic in May more than 2 weeks after completing antibiotics to check for surveillance blood cultures.  Thank so much for allowing us to participate in the care of this patient.  Review of Systems: Review of Systems  Unable to perform ROS: Critical illness    Past Medical History:  Diagnosis Date  . Atrial fibrillation (HCC)   . AV block   . CAD (coronary artery disease)   . Congestive heart failure (CHF) (HCC)   . COPD (chronic obstructive pulmonary disease) (HCC)   . Diabetes (HCC)   . GERD (gastroesophageal reflux disease)   . Hypertension   . Left bundle branch block   . Localized edema   . TIA  (transient ischemic attack)   . Vitamin D deficiency     Social History   Tobacco Use  . Smoking status: Former Games developer  . Smokeless tobacco: Never Used  Substance Use Topics  . Alcohol use: Never  . Drug use: Never    Family History  Problem Relation Age of Onset  . Diabetes Mellitus II Neg Hx    Allergies  Allergen Reactions  . Doxycycline Anaphylaxis  . Aspirin Other (See Comments)    Advised not to use due to family hx of macular degeneration Advised not to use due to family hx of macular degeneration   . Codeine   . Tramadol   . Ace Inhibitors Rash  . Hydrocodone Nausea And Vomiting  . Hydrocodone-Acetaminophen Nausea And Vomiting  . Oxycodone-Acetaminophen Nausea And Vomiting  . Propoxyphene Nausea And Vomiting  . Statins Rash  . Sulfa Antibiotics Rash    OBJECTIVE: Blood pressure 136/71, pulse 98, temperature 98.1 F (36.7 C), resp. rate 20, height 5\' 4"  (1.626 m), weight 104.1 kg, SpO2 97 %.  Physical Exam Constitutional:      General: She is not in acute distress.    Appearance: Normal appearance. She is well-developed. She is not ill-appearing or diaphoretic.  HENT:     Head: Normocephalic and atraumatic.     Right Ear: Hearing and external ear normal.     Left Ear: Hearing and external ear normal.     Nose: No nasal deformity or rhinorrhea.  Eyes:     General: No scleral icterus.    Conjunctiva/sclera: Conjunctivae normal.     Right eye: Right conjunctiva is not injected.     Left eye: Left conjunctiva is not injected.     Pupils: Pupils are equal, round, and reactive to light.  Neck:     Vascular: No JVD.  Cardiovascular:     Rate and Rhythm: Tachycardia present. Rhythm irregular.     Heart sounds: Normal heart sounds, S1 normal and S2 normal. No friction rub.  Pulmonary:     Effort: Pulmonary effort is normal. No respiratory distress.     Breath sounds: Normal breath sounds. No stridor. No wheezing or rhonchi.  Abdominal:     General:  Abdomen is flat. Bowel sounds are normal. There is no distension.     Palpations: Abdomen is soft. There is no mass.     Tenderness: There is no abdominal tenderness.  Musculoskeletal:        General: Normal range of motion.     Right shoulder: Normal.     Left shoulder: Normal.     Cervical back: Normal range of motion and neck supple.     Right hip: Normal.     Left hip: Normal.     Right knee: Normal.     Left knee: Normal.  Lymphadenopathy:     Head:     Right side of head: No submandibular, preauricular or posterior auricular adenopathy.     Left side of  head: No submandibular, preauricular or posterior auricular adenopathy.     Cervical: No cervical adenopathy.     Right cervical: No superficial or deep cervical adenopathy.    Left cervical: No superficial or deep cervical adenopathy.  Skin:    General: Skin is warm and dry.     Coloration: Skin is not pale.     Findings: No abrasion, bruising, ecchymosis, erythema, lesion or rash.     Nails: There is no clubbing.  Neurological:     Mental Status: She is alert.     Sensory: No sensory deficit.     Coordination: Coordination normal.     Gait: Gait normal.  Psychiatric:        Attention and Perception: She is attentive.        Speech: Speech is delayed.        Behavior: Behavior is cooperative.        Thought Content: Thought content normal.        Cognition and Memory: Memory is impaired. She exhibits impaired recent memory.        Judgment: Judgment normal.     Lab Results Lab Results  Component Value Date   WBC 11.0 (H) 10/03/2020   HGB 13.0 10/03/2020   HCT 40.9 10/03/2020   MCV 97.6 10/03/2020   PLT 164 10/03/2020    Lab Results  Component Value Date   CREATININE 0.84 10/03/2020   BUN 22 10/03/2020   NA 142 10/03/2020   K 4.1 10/03/2020   CL 102 10/03/2020   CO2 34 (H) 10/03/2020    Lab Results  Component Value Date   ALT 20 09/30/2020   AST 19 09/30/2020   ALKPHOS 94 09/30/2020   BILITOT 1.6 (H)  09/30/2020     Microbiology: Recent Results (from the past 240 hour(s))  Resp Panel by RT-PCR (Flu A&B, Covid) Nasopharyngeal Swab     Status: None   Collection Time: 09/29/20  7:22 PM   Specimen: Nasopharyngeal Swab; Nasopharyngeal(NP) swabs in vial transport medium  Result Value Ref Range Status   SARS Coronavirus 2 by RT PCR NEGATIVE NEGATIVE Final    Comment: (NOTE) SARS-CoV-2 target nucleic acids are NOT DETECTED.  The SARS-CoV-2 RNA is generally detectable in upper respiratory specimens during the acute phase of infection. The lowest concentration of SARS-CoV-2 viral copies this assay can detect is 138 copies/mL. A negative result does not preclude SARS-Cov-2 infection and should not be used as the sole basis for treatment or other patient management decisions. A negative result may occur with  improper specimen collection/handling, submission of specimen other than nasopharyngeal swab, presence of viral mutation(s) within the areas targeted by this assay, and inadequate number of viral copies(<138 copies/mL). A negative result must be combined with clinical observations, patient history, and epidemiological information. The expected result is Negative.  Fact Sheet for Patients:  BloggerCourse.com  Fact Sheet for Healthcare Providers:  SeriousBroker.it  This test is no t yet approved or cleared by the [place] FDA and  has been authorized for detection and/or diagnosis of SARS-CoV-2 by FDA under an Emergency Use Authorization (EUA). This EUA will remain  in effect (meaning this test can be used) for the duration of the COVID-19 declaration under Section 564(b)(1) of the Act, 21 U.S.C.section 360bbb-3(b)(1), unless the authorization is terminated  or revoked sooner.       Influenza A by PCR NEGATIVE NEGATIVE Final   Influenza B by PCR NEGATIVE NEGATIVE Final    Comment: (NOTE) The  Xpert Xpress SARS-CoV-2/FLU/RSV  plus assay is intended as an aid in the diagnosis of influenza from Nasopharyngeal swab specimens and should not be used as a sole basis for treatment. Nasal washings and aspirates are unacceptable for Xpert Xpress SARS-CoV-2/FLU/RSV testing.  Fact Sheet for Patients: BloggerCourse.com  Fact Sheet for Healthcare Providers: SeriousBroker.it  This test is not yet approved or cleared by the [place] FDA and has been authorized for detection and/or diagnosis of SARS-CoV-2 by FDA under an Emergency Use Authorization (EUA). This EUA will remain in effect (meaning this test can be used) for the duration of the COVID-19 declaration under Section 564(b)(1) of the Act, 21 U.S.C. section 360bbb-3(b)(1), unless the authorization is terminated or revoked.  Performed at [facility], [address] [place], [place] [address]   Culture, blood (routine x 2)     Status: Abnormal   Collection Time: 09/29/20  9:10 PM   Specimen: BLOOD  Result Value Ref Range Status   Specimen Description BLOOD RIGHT ANTECUBITAL  Final   Special Requests   Final    BOTTLES DRAWN AEROBIC AND ANAEROBIC Blood Culture adequate volume   Culture  Setup Time   Final    GRAM NEGATIVE RODS IN BOTH AEROBIC AND ANAEROBIC BOTTLES PHARMD E.WOLFE AT 1256 ON 09/30/2020 BY T.SAAD Performed at [facility], [address] [place], [place] [address]    Culture ESCHERICHIA COLI (A)  Final   Report Status 10/02/2020 FINAL  Final   Organism ID, Bacteria ESCHERICHIA COLI  Final      Susceptibility   Escherichia coli - MIC*    AMPICILLIN <=2 SENSITIVE Sensitive     CEFAZOLIN <=4 SENSITIVE Sensitive     CEFEPIME <=0.12 SENSITIVE Sensitive     CEFTAZIDIME <=1 SENSITIVE Sensitive     CEFTRIAXONE <=0.25 SENSITIVE Sensitive     CIPROFLOXACIN 0.5 SENSITIVE Sensitive     GENTAMICIN <=1 SENSITIVE Sensitive     IMIPENEM <=0.25 SENSITIVE Sensitive     TRIMETH/SULFA <=20  SENSITIVE Sensitive     AMPICILLIN/SULBACTAM <=2 SENSITIVE Sensitive     PIP/TAZO <=4 SENSITIVE Sensitive     * ESCHERICHIA COLI  Blood Culture ID Panel (Reflexed)     Status: Abnormal   Collection Time: 09/29/20  9:10 PM  Result Value Ref Range Status   Enterococcus faecalis NOT DETECTED NOT DETECTED Final   Enterococcus Faecium NOT DETECTED NOT DETECTED Final   Listeria monocytogenes NOT DETECTED NOT DETECTED Final   Staphylococcus species NOT DETECTED NOT DETECTED Final   Staphylococcus aureus (BCID) NOT DETECTED NOT DETECTED Final   Staphylococcus epidermidis NOT DETECTED NOT DETECTED Final   Staphylococcus lugdunensis NOT DETECTED NOT DETECTED Final   Streptococcus species NOT DETECTED NOT DETECTED Final   Streptococcus agalactiae NOT DETECTED NOT DETECTED Final   Streptococcus pneumoniae NOT DETECTED NOT DETECTED Final   Streptococcus pyogenes NOT DETECTED NOT DETECTED Final   A.calcoaceticus-baumannii NOT DETECTED NOT DETECTED Final   Bacteroides fragilis NOT DETECTED NOT DETECTED Final   Enterobacterales DETECTED (A) NOT DETECTED Final    Comment: Enterobacterales represent a large order of gram negative bacteria, not a single organism. CRITICAL RESULT CALLED TO, READ BACK BY AND VERIFIED WITH: PHARMD E.WOLFE AT 1256 ON 09/30/2020 BY T.SAAD.    Enterobacter cloacae complex NOT DETECTED NOT DETECTED Final   Escherichia coli DETECTED (A) NOT DETECTED Final    Comment: CRITICAL RESULT CALLED TO, READ BACK BY AND VERIFIED WITH: PHARMD E.WOLFE AT 1256 ON 09/30/2020 BY T.SAAD.  Klebsiella aerogenes NOT DETECTED NOT DETECTED Final   Klebsiella oxytoca NOT DETECTED NOT DETECTED Final   Klebsiella pneumoniae NOT DETECTED NOT DETECTED Final   Proteus species NOT DETECTED NOT DETECTED Final   Salmonella species NOT DETECTED NOT DETECTED Final   Serratia marcescens NOT DETECTED NOT DETECTED Final   Haemophilus influenzae NOT DETECTED NOT DETECTED Final   Neisseria meningitidis NOT  DETECTED NOT DETECTED Final   Pseudomonas aeruginosa NOT DETECTED NOT DETECTED Final   Stenotrophomonas maltophilia NOT DETECTED NOT DETECTED Final   Candida albicans NOT DETECTED NOT DETECTED Final   Candida auris NOT DETECTED NOT DETECTED Final   Candida glabrata NOT DETECTED NOT DETECTED Final   Candida krusei NOT DETECTED NOT DETECTED Final   Candida parapsilosis NOT DETECTED NOT DETECTED Final   Candida tropicalis NOT DETECTED NOT DETECTED Final   Cryptococcus neoformans/gattii NOT DETECTED NOT DETECTED Final   CTX-M ESBL NOT DETECTED NOT DETECTED Final   Carbapenem resistance IMP NOT DETECTED NOT DETECTED Final   Carbapenem resistance KPC NOT DETECTED NOT DETECTED Final   Carbapenem resistance NDM NOT DETECTED NOT DETECTED Final   Carbapenem resist OXA 48 LIKE NOT DETECTED NOT DETECTED Final   Carbapenem resistance VIM NOT DETECTED NOT DETECTED Final    Comment: Performed at [facility], [address], [place], [place] [address]  Culture, blood (routine x 2)     Status: None (Preliminary result)   Collection Time: 09/29/20  9:17 PM   Specimen: BLOOD LEFT HAND  Result Value Ref Range Status   Specimen Description BLOOD LEFT HAND  Final   Special Requests   Final    BOTTLES DRAWN AEROBIC AND ANAEROBIC Blood Culture results may not be optimal due to an inadequate volume of blood received in culture bottles   Culture   Final    NO GROWTH 4 DAYS Performed at [facility], [address], [place], [place] [address]    Report Status PENDING  Incomplete  Culture, Urine     Status: Abnormal   Collection Time: 09/29/20  9:56 PM   Specimen: Urine, Random  Result Value Ref Range Status   Specimen Description URINE, RANDOM  Final   Special Requests   Final    NONE Performed at [facility], [address], [place], [place] [address]    Culture 50,000 COLONIES/mL ESCHERICHIA COLI (A)  Final   Report Status 10/03/2020 FINAL  Final   Organism ID, Bacteria ESCHERICHIA  COLI (A)  Final      Susceptibility   Escherichia coli - MIC*    AMPICILLIN <=2 SENSITIVE Sensitive     CEFAZOLIN <=4 SENSITIVE Sensitive     CEFEPIME <=0.12 SENSITIVE Sensitive     CEFTRIAXONE <=0.25 SENSITIVE Sensitive     CIPROFLOXACIN 0.5 SENSITIVE Sensitive     GENTAMICIN <=1 SENSITIVE Sensitive     IMIPENEM <=0.25 SENSITIVE Sensitive     NITROFURANTOIN <=16 SENSITIVE Sensitive     TRIMETH/SULFA <=20 SENSITIVE Sensitive     AMPICILLIN/SULBACTAM <=2 SENSITIVE Sensitive     PIP/TAZO <=4 SENSITIVE Sensitive     * 50,000 COLONIES/mL ESCHERICHIA COLI  MRSA PCR Screening     Status: None   Collection Time: 09/30/20 10:00 AM   Specimen: Nasopharyngeal  Result Value Ref Range Status   MRSA by PCR NEGATIVE NEGATIVE Final    Comment:        The GeneXpert MRSA Assay (FDA approved for NASAL specimens only), is one component of a comprehensive  MRSA colonization surveillance program. It is not intended to diagnose MRSA infection nor to guide or monitor treatment for MRSA infections. Performed at [facility], [address] [place], [place] [address]     Acey Lav, MD [facility] [phone] pager  10/03/2020, 3:53 PM

## 2020-10-03 NOTE — Evaluation (Signed)
Occupational Therapy Evaluation Patient Details Name: Brianna Lowe MRN: [id number] DOB: 1935-01-26 Today's Date: 10/03/2020    History of Present Illness Pt is an 86 yo female s/p nausea, vomiting, and diarrhead, UTI, PNA and SOB requiring O2. PMHx: Afib, T2DM, H/O CAD, L femur fx, Dementia.   Clinical Impression   Pt PTA:  Pt living in ILF with caregiver to assist with ADL and meals provided for pt from facility. Pt very lethargic and disoriented upon arrival today and required cues to stay alert and following commands. Pt  as time went on, pt answering questions more clearly. Pt limited be decreased strength and increased assist for ADL. Pt set-upA to modA for ADL and MinA+2 overall for mobility and transfers with RW for safety/stability. Pt taking a few steps to recliner, but abruptly sat down. Pt  2L O2 at ret 100% O2; o2 on RA 92% after exertion; pt left on 2L to conclude session. Pt would benefit from continued OT skilled services. OT following acutely. *May have to consider SNF if pt does not progress.      Follow Up Recommendations  Home health OT;Other (comment) (ILF)    Equipment Recommendations  3 in 1 bedside commode    Recommendations for Other Services       Precautions / Restrictions Precautions Precautions: Fall;Other (comment) Precaution Comments: watch O2 Restrictions Weight Bearing Restrictions: No      Mobility Bed Mobility Overal bed mobility: Needs Assistance Bed Mobility: Supine to Sit     Supine to sit: Mod assist;+2 for physical assistance;+2 for safety/equipment     General bed mobility comments: Assist for trunk and BLEs    Transfers Overall transfer level: Needs assistance Equipment used: Rolling walker (2 wheeled) Transfers: Sit to/from [facility] Sit to Stand: Min assist;+2 safety/equipment Stand pivot transfers: Min assist       General transfer comment: MinA for stability and for stability as pt was very  lethargic.    Balance Overall balance assessment: Needs assistance Sitting-balance support: Bilateral upper extremity supported Sitting balance-Leahy Scale: Fair Sitting balance - Comments: fair balance   Standing balance support: Bilateral upper extremity supported Standing balance-Leahy Scale: Poor Standing balance comment: reliant on RW                           ADL either performed or assessed with clinical judgement   ADL Overall ADL's : Needs assistance/impaired Eating/Feeding: Set up   Grooming: Set up;Sitting   Upper Body Bathing: Set up;Sitting   Lower Body Bathing: Moderate assistance;Sitting/lateral leans;Cueing for safety   Upper Body Dressing : Set up;Sitting   Lower Body Dressing: Moderate assistance;+2 for physical assistance;+2 for safety/equipment;Cueing for safety;Sitting/lateral leans;Sit to/from stand   Toilet Transfer: Minimal assistance;Stand-pivot;RW Toilet Transfer Details (indicate cue type and reason): MinA for safety as pt very drowsy. Simulated to recliner Toileting- Clothing Manipulation and Hygiene: Moderate assistance;Cueing for safety;Sitting/lateral lean;Sit to/from stand       Functional mobility during ADLs: Minimal assistance;Rolling walker;Cueing for safety;Cueing for sequencing General ADL Comments: Pt limited by lethargy and confusion initially in viist; as time went on, pt answering questions more clearly. Pt limited be decreased strength and increased assist for ADL     Vision Baseline Vision/History: Wears glasses Wears Glasses: Reading only Patient Visual Report: No change from baseline Vision Assessment?: No apparent visual deficits     Perception     Praxis      Pertinent Vitals/Pain Pain Assessment: Faces  Faces Pain Scale: Hurts a little bit Pain Location: generalized Pain Descriptors / Indicators: Discomfort;Grimacing Pain Intervention(s): Limited activity within patient's tolerance;Monitored during  session;Repositioned     Hand Dominance Right   Extremity/Trunk Assessment Upper Extremity Assessment Upper Extremity Assessment: Defer to OT evaluation   Lower Extremity Assessment Lower Extremity Assessment: Generalized weakness   Cervical / Trunk Assessment Cervical / Trunk Assessment: Kyphotic   Communication Communication Communication: No difficulties   Cognition Arousal/Alertness: Lethargic Behavior During Therapy: WFL for tasks assessed/performed Overall Cognitive Status: No family/caregiver present to determine baseline cognitive functioning                                 General Comments: Pt talking with son on phone and was able to recall reason for being in hospital as UTI. Pt woke up as time went on. Pt was very lethargic upon arrival. Pt following commands and eventually perked up.   General Comments  2L O2 at ret 100% O2; o2 on RA 92% after exertion; pt left on 2L to conclude session.    Exercises     Shoulder Instructions      Home Living Family/patient expects to be discharged to:: Private residence Living Arrangements: Alone Available Help at Discharge: Personal care attendant Type of Home: Independent living facility                       Home Equipment: Gilford Rile - 2 wheels   Additional Comments: ILF with caregiver daily for "several hours" for bathe/dress      Prior Functioning/Environment Level of Independence: Needs assistance  Gait / Transfers Assistance Needed: use of RW for stability ADL's / Homemaking Assistance Needed: Pt reports assist with bathe/dress. Pt independent with grooming, eating. Pt's meals provided for her at facility.            OT Problem List:        OT Treatment/Interventions: Self-care/ADL training;Therapeutic exercise;Energy conservation;DME and/or AE instruction;Therapeutic activities;Patient/family education;Balance training    OT Goals(Current goals can be found in the care plan section)  Acute Rehab OT Goals Patient Stated Goal: to go home OT Goal Formulation: With patient Time For Goal Achievement: 10/17/20 Potential to Achieve Goals: Good ADL Goals Pt Will Perform Grooming: with supervision;standing Pt Will Transfer to Toilet: with supervision;ambulating;regular height toilet Pt Will Perform Toileting - Clothing Manipulation and hygiene: with min guard assist;sitting/lateral leans;sit to/from stand Additional ADL Goal #1: Pt will follow multi step commands with <2 verbal cues for attention to task. Additional ADL Goal #2: Pt will perform x10 mins of OOB ADL with minA overall and O2 >90% to increase activity tolerance.  OT Frequency: Min 2X/week   Barriers to D/C:            Co-evaluation PT/OT/SLP Co-Evaluation/Treatment: Yes Reason for Co-Treatment: Necessary to address cognition/behavior during functional activity;For patient/therapist safety PT goals addressed during session: Mobility/safety with mobility;Balance;Proper use of DME;Strengthening/ROM OT goals addressed during session: ADL's and self-care      AM-PAC OT "6 Clicks" Daily Activity     Outcome Measure Help from another person eating meals?: None Help from another person taking care of personal grooming?: A Little Help from another person toileting, which includes using toliet, bedpan, or urinal?: A Lot Help from another person bathing (including washing, rinsing, drying)?: A Lot Help from another person to put on and taking off regular upper body clothing?: A Little Help from  another person to put on and taking off regular lower body clothing?: A Lot 6 Click Score: 16   End of Session Equipment Utilized During Treatment: Gait belt;Rolling walker;Oxygen Nurse Communication: Mobility status  Activity Tolerance: Patient limited by lethargy;Patient limited by pain Patient left: in chair;with call bell/phone within reach;with chair alarm set  OT Visit Diagnosis: Unsteadiness on feet (R26.81);Muscle  weakness (generalized) (M62.81);Pain;Other symptoms and signs involving cognitive function Pain - part of body:  (generalized)                Time: 1030-1056 OT Time Calculation (min): 26 min Charges:  OT General Charges $OT Visit: 1 Visit OT Evaluation $OT Eval Moderate Complexity: 1 Mod  Jefferey Pica, OTR/L Acute Rehabilitation Services Pager: [phone] Office: [phone]   Nejla Reasor  C 10/03/2020, 1:27 PM

## 2020-10-03 NOTE — TOC Initial Note (Signed)
Transition of Care [facility]) - Initial/Assessment Note    Patient Details  Name: Brianna Lowe MRN: [id number] Date of Birth: 12/07/1934  Transition of Care [facility]) CM/SW Contact:    Joanne Chars, LCSW Phone Number: 10/03/2020, 3:04 PM  Clinical Narrative:    CSW met with pt in AM to discuss discharge plan.  Pt anxious, somewhat confused, asking "what is wrong with me."  Pt lives in independent living at [facility] with assistance from [facility] aide.  She reports she is vaccinated and boosted for covid. Permission given to speak with daughter Lanelle Bal.   CSW spoke with daughter Lanelle Bal, initially talking about HH.  MD later expressed some concern for pt to be in independent living currently, CSW called daughter back and daughter is in agreement with pt going to rehab at [facility]. Pt has been there several times, most recently right around January 2022.   CSW spoke with Luellen Pucker at [facility] and she will check status of financials, but reports they do have bed space for pt to return to rehab.  If pt goes to independent living, they need outpt PT/OT orders faxed to [phone].                Expected Discharge Plan: Skilled Nursing Facility Barriers to Discharge: Continued Medical Work up   Patient Goals and CMS Choice Patient states their goals for this hospitalization and ongoing recovery are:: "find out what's wrong" CMS Medicare.gov Compare Post Acute Care list provided to::  (NA-current [place] resident)    Expected Discharge Plan and Services Expected Discharge Plan: [place] Choice: [place] Living arrangements for the past 2 months: [place]                                      Prior Living Arrangements/Services Living arrangements for the past 2 months: [place] Lives with:: Self Patient language and need for interpreter reviewed:: Yes Do you feel safe going  back to the place where you live?: Yes      Need for Family Participation in Patient Care: Yes (Comment) Care giver support system in place?: Yes (comment) Current home services: Homehealth aide Criminal Activity/Legal Involvement Pertinent to Current Situation/Hospitalization: No - Comment as needed  Activities of Daily Living      Permission Sought/Granted Permission sought to share information with : Family Supports Permission granted to share information with : Yes, Verbal Permission Granted  Share Information with NAME: daughter Lanelle Bal  Permission granted to share info w AGENCY: [place] [facility] Assessment Appearance:: Appears stated age Attitude/Demeanor/Rapport: Engaged Affect (typically observed): Anxious Orientation: : Oriented to Self Alcohol / Substance Use: Not Applicable Psych Involvement: No (comment)  Admission diagnosis:  Hypoxemia [R09.02] SOB (shortness of breath) [R06.02] SIRS (systemic inflammatory response syndrome) (HCC) [R65.10] Acute febrile illness [R50.9] Acute cystitis with hematuria [N30.01] Generalized weakness [R53.1] Sepsis ([place]) [A41.9] Acute respiratory failure with hypoxia and hypercapnia (HCC) [J96.01, J96.02] Fever, unspecified fever cause [R50.9] Sepsis, due to unspecified organism, unspecified whether acute organ dysfunction present [facility]) [A41.9] Patient Active Problem List   Diagnosis Date Noted  . Severe sepsis ([place]) 09/30/2020  . Acute respiratory failure with hypoxia and hypercapnia ([place]) 09/30/2020  . Acute febrile illness 09/29/2020  . SIRS (systemic inflammatory response syndrome) ([place]) 09/29/2020  . ARF (acute renal  failure) ([place]) 09/29/2020  . PAF (paroxysmal atrial fibrillation) ([place]) 09/29/2020   PCP:  Pcp, No Pharmacy:   [facility] [phone] - [place], [place] - [address] AT [place] [address] [place] [address] Phone: [phone] Fax:  [phone]     Social Determinants of Health (SDOH) Interventions    Readmission Risk Interventions No flowsheet data found.

## 2020-10-04 DIAGNOSIS — R6520 Severe sepsis without septic shock: Secondary | ICD-10-CM | POA: Diagnosis not present

## 2020-10-04 DIAGNOSIS — A419 Sepsis, unspecified organism: Secondary | ICD-10-CM | POA: Diagnosis not present

## 2020-10-04 LAB — CULTURE, BLOOD (ROUTINE X 2): Culture: NO GROWTH

## 2020-10-04 LAB — GLUCOSE, CAPILLARY
Glucose-Capillary: 245 mg/dL — ABNORMAL HIGH (ref 70–99)
Glucose-Capillary: 229 mg/dL — ABNORMAL HIGH (ref 70–99)
Glucose-Capillary: 216 mg/dL — ABNORMAL HIGH (ref 70–99)

## 2020-10-04 LAB — SARS CORONAVIRUS 2 (TAT 6-24 HRS): SARS Coronavirus 2: NEGATIVE

## 2020-10-04 MED ORDER — AMOXICILLIN 500 MG PO TABS: 500.0000 mg | ORAL_TABLET | Freq: Three times a day (TID) | ORAL | 0 refills | Status: AC

## 2020-10-04 MED ORDER — DILTIAZEM HCL ER COATED BEADS 120 MG PO CP24
120.0000 mg | ORAL_CAPSULE | Freq: Every day | ORAL | Status: DC
  Administered 2020-10-04: 120 mg via ORAL
  Filled 2020-10-04: qty 1

## 2020-10-04 MED ORDER — TOUJEO MAX SOLOSTAR 300 UNIT/ML ~~LOC~~ SOPN: 20.0000 [IU] | PEN_INJECTOR | Freq: Every day | SUBCUTANEOUS | Status: AC

## 2020-10-04 NOTE — Care Management Important Message (Signed)
Important Message  Patient Details  Name: Brianna Lowe MRN: [id number] Date of Birth: [birth year]   Medicare Important Message Given:  Yes     Kyrielle Urbanski Stefan Church 10/04/2020, 3:53 PM

## 2020-10-04 NOTE — Progress Notes (Addendum)
Progress Note  Patient Name: Brianna Lowe Date of Encounter: 10/04/2020   [facility] Cardiologist: Dr Donnamarie Poag (HP)  Dr Reuel Boom [facility]) and Dr Donnamarie Poag  Subjective   PT denies CP  No SOB    Inpatient Medications    Scheduled Meds: . amoxicillin  500 mg Oral Q8H  . apixaban  5 mg Oral BID  . Chlorhexidine Gluconate Cloth  6 each Topical Daily  . ezetimibe  10 mg Oral Daily  . insulin aspart  0-5 Units Subcutaneous QHS  . insulin aspart  0-9 Units Subcutaneous TID WC  . insulin detemir  10 Units Subcutaneous BID  . metoprolol tartrate  75 mg Oral BID  . pantoprazole  40 mg Oral Daily   Continuous Infusions: . sodium chloride     PRN Meds: acetaminophen **OR** acetaminophen, ipratropium-albuterol, lip balm, melatonin   Vital Signs    Vitals:   10/03/20 1000 10/03/20 1554 10/03/20 1943 10/04/20 0455  BP:  (!) 150/80 (!) 147/68 138/78  Pulse: 98 96 87 90  Resp:  16 19 20   Temp:  98.6 F (37 C) 97.6 F (36.4 C) 97.6 F (36.4 C)  TempSrc:    Axillary  SpO2: 97% 100% 96% 98%  Weight:      Height:        Intake/Output Summary (Last 24 hours) at 10/04/2020 0911 Last data filed at 10/04/2020 0715 Gross per 24 hour  Intake 200 ml  Output 700 ml  Net -500 ml   Last 3 Weights 10/02/2020 10/01/2020 09/29/2020  Weight (lbs) 229 lb 8 oz 149 lb 14.6 oz 150 lb  Weight (kg) 104.1 kg 68 kg 68.04 kg      Telemetry     Afib 90s to 100s- Personally Reviewed  ECG    -None new Personally Reviewed  Physical Exam   GEN: Obese 86 yo in acute distress.  Eager to go home  Neck: Neck is full   Cardiac: Irreg irreg , no murmurs Respiratory: Rel clear bilaterally  GI: Soft, nontender, non-distended  MS: No  LE  edema; No deformity. Neuro:  Nonfocal  Psych: Normal affect   Labs    High Sensitivity Troponin:   Recent Labs  Lab 09/29/20 2011 09/30/20 0501 09/30/20 0747 09/30/20 1051 10/01/20 1017  TROPONINIHS 19* 54* 201* 720* 610*      Chemistry Recent Labs  Lab  09/29/20 1820 09/30/20 0501 09/30/20 0712 10/01/20 0100 10/02/20 0509 10/03/20 0346  NA 132* 136   < > 138 137 142  K 5.0 4.3   < > 4.3 4.3 4.1  CL 96* 98  --  105 102 102  CO2 23 25  --  25 28 34*  GLUCOSE 214* 206*  --  198* 207* 123*  BUN 25* 27*  --  37* 32* 22  CREATININE 1.24* 1.69*  --  1.76* 1.09* 0.84  CALCIUM 8.3* 8.6*  --  8.1* 8.6* 9.0  PROT 5.9* 6.1*  --   --   --   --   ALBUMIN 2.9* 2.9*  --   --   --   --   AST 25 19  --   --   --   --   ALT 15 20  --   --   --   --   ALKPHOS 99 94  --   --   --   --   BILITOT 3.4* 1.6*  --   --   --   --   [facility]  43* 30*  --  28* 50* >60  ANIONGAP 13 13  --  8 7 6    < > = values in this interval not displayed.     Hematology Recent Labs  Lab 10/01/20 0100 10/02/20 0509 10/03/20 0346  WBC 19.9* 17.1* 11.0*  RBC 3.71* 3.73* 4.19  HGB 11.5* 11.8* 13.0  HCT 35.9* 36.5 40.9  MCV 96.8 97.9 97.6  MCH 31.0 31.6 31.0  MCHC 32.0 32.3 31.8  RDW 13.5 13.4 13.1  PLT 163 152 164    BNP Recent Labs  Lab 09/30/20 0453  BNP 384.7*     DDimer No results for input(s): DDIMER in the last 168 hours.   Radiology    No results found.  Cardiac Studies   Echo:   09/30/20  1. Left ventricular ejection fraction, by estimation, is 60 to 65%. The  left ventricle has normal function. The left ventricle has no regional  wall motion abnormalities. There is moderate concentric left ventricular  hypertrophy. Left ventricular  diastolic function could not be evaluated. Elevated left ventricular  end-diastolic pressure.  2. Right ventricular systolic function was not well visualized. The right  ventricular size is not well visualized.  3. Left atrial size was severely dilated.  4. Right atrial size was mildly dilated.  5. The mitral valve is rheumatic. Trivial mitral valve regurgitation.  Moderate mitral stenosis. The mean mitral valve gradient is 6.0 mmHg.  Moderate to severe mitral annular calcification.  6. The aortic  valve has been repaired/replaced. Aortic valve  regurgitation not well visualized. There is a 23 mm Sapien prosthetic  (TAVR) valve present in the aortic position. Procedure Date: 06/03/20.  Aortic valve mean gradient measures 13.0 mmHg.  7. The inferior vena cava is dilated in size with <50% respiratory  variability, suggesting right atrial pressure of 15 mmHg.   Echo  08/15/20  SUMMARY Left ventricular systolic function is normal. LV ejection fraction = 65-70%. No segmental wall motion abnormalities seen in the left ventricle The right ventricle is normal in size and function. The left atrial size is normal. 23mm Sapien bioprosthetic valve in the aortic position. Mean pressure gradient of 15 mmHg. There is no aortic regurgitation. There is mild aortic stenosis. There is moderate mitral stenosis. Mean transmitral pressure gradient of 9 mmHg. The heart rate for the mean mitral valve gradient is 94 BPM. There is no significant change in comparison with the last study.  Cath 04/06/20  DIAGNOSTIC FINDINGS:   1. Severe aortic stenosis, mean gradient 47 mmHg. Aortic valve area 0.59  cm.  2. Normal cardiac output, 4.8 L/min (cardiac index 2.3).  3. Moderate pulmonary hypertension, 54/20 6 mmHg.  4. Elevated right atrial and left atrial filling pressures, consistent  with intravascular salt/volume overload.  5. Obstructive coronary artery disease involving the mid RCA and first  diagonal. Borderline mid LAD disease.   COMPLICATIONS: None   RECOMMENDATIONS:   Continue diuretic therapy, given the patient's significant salt/volume  overload.   A low-salt diet, antihypertensive therapy, and regular exercise are  strongly recommended, as these are the hallmarks of management of chronic  heart failure with preserved ejection fraction.   Given that the patient has no angina, revascularization of her coronary  arteries is not necessary at this time. We will manage her  coronary  artery disease medically for now.   I will have a clinic appointment with the patient and family members  within the next few weeks to discuss management of her severe  aortic  stenosis. Specifically, we will discuss whether she wishes to proceed  with a transcatheter aortic valve replacement versus a palliative care  approach.   Patient Profile      Shebra Muldrow is a 86 y.o. female with a hx of aortic stenosis s/p TAVR 06/03/2020, mitral stenosis (rheumatic fever at 86yo), DM2, history of TIA, paroxysmal atrial fibrillation on chronic anticoagulation, borderline obstructive CAD with first diagonal lesion at 70% and a mid RCA lesion at 80% per cath 03/2020, HTN, HLD, carotid artery stenosis with a known 70% stenosis of the LICA, pulmonary hypertension and former tobacco use who is being seen today for the evaluation of elevated troponin at the request of Dr. Everardo All.  Assessment & Plan    1   CAD/ Elevated troponin   Probably due to demand ischemia in sepsis  No symptoms of angina   2  PAF   Pt with hx of PAF   Remains in afib   Note she is being placed back on all of home meds   COntinue Eliquis   Will follow up with Dr Donnamarie Poag in [facility]    3  S/P TAVR  05/2020 [facility], Dr Reuel Boom)  23 mm Sapien bioprosthesis   Follow in [place]    4  Mod Mitral stenosis  REmains mod on this echo    WIll need t obe followed  4.  Sepsis  Continues on ABX   4  Blood pressure    BP is OK this AM  Again, follow as outpt      5   CVZ   70% LICA  Follows with vascular surgery  6  HL On Zetia  Intolerant to statins  Rx Zetia     Will follow with Dr Donnamarie Poag to discuss/ review  ? Repatha  Will sign off  For questions or updates, please contact [facility] Please consult [email or web address] for contact info under        Signed, Dietrich Pates, MD  10/04/2020, 9:11 AM

## 2020-10-04 NOTE — TOC Transition Note (Signed)
Transition of Care [facility]) - CM/SW Discharge Note   Patient Details  Name: Brianna Lowe MRN: [id number] Date of Birth: July 14, 1934  [facility]) CM/SW Contact:  Lorri Frederick, LCSW Phone Number: 10/04/2020, 1:04 PM   Clinical Narrative:   Pt discharging to [facility].  RN call report to [phone].  CSW spoke with daughter Dorene Grebe who initially requested to transport pt, however pt is on O2, PTAR to transport.  CSW communicated with Doren Custard at [facility] who reported pt will have new medicare episode of care and also has 14 days at SNF due to being a resident there, so finances are not an issue.   Final next level of care: Skilled Nursing Facility Barriers to Discharge: Barriers Resolved   Patient Goals and CMS Choice Patient states their goals for this hospitalization and ongoing recovery are:: "find out what's wrong" CMS Medicare.gov Compare Post Acute Care list provided to::  (NA-current [place] resident)    Discharge Placement              Patient chooses bed at:  [facility]) Patient to be transferred to facility by: PTAR Name of family member notified: Dorene Grebe, daughter Patient and family notified of of transfer: 10/04/20  Discharge Plan and Services     Post Acute Care Choice: Skilled Nursing Facility                               Social Determinants of Health (SDOH) Interventions     Readmission Risk Interventions No flowsheet data found.

## 2020-10-04 NOTE — Discharge Summary (Signed)
Physician Discharge Summary  Brianna Lowe ZOX:[id number] DOB: [birth year] DOA: 09/29/2020  PCP: Oneita Hurt, No  Admit date: 09/29/2020 Discharge date: 10/04/2020  Admitted From: Independent living Disposition: Skilled nursing facility  Recommendations for Outpatient Follow-up:  1. Follow up with PCP at the skilled nursing facility. 2. Please obtain BMP/CBC in one week 3. Continue to monitor blood sugars and change insulin regimen if needed.  Home Health: Not applicable Equipment/Devices: Oxygen  Discharge Condition: Stable CODE STATUS: Full code Diet recommendation: Low-carb, low-salt diet  Discharge summary: 86 year old female who lives in an independent living facility at [facility], she has history of TAVR, paroxysmal A. fib, type 2 diabetes, mitral stenosis, history of coronary artery disease, recent left femur fracture who presented to the emergency room with progressive weakness for about 24 hours, nausea vomiting and episode of diarrhea.  She was recently treated with antibiotics for rash on her chest.  In the emergency room, she was febrile with temperature 103.  Initially on 2 L oxygen, chest x-ray showing right lower lobe pneumonia.  Lactic acid was 2.  WBC count was 17.  UA was abnormal.  Patient was started on antibiotics with sepsis protocol, she became more short of breath needing heated high flow oxygen and was admitted to ICU.  4/14, admission to medical floor for sepsis and fever, UTI and possible aspiration pneumonia, developed progressive respiratory distress on BiPAP, started on vancomycin and ceftriaxone azithromycin and admitted to ICU.  Also transiently needed on vasopressor. 4/16, transferred to medical floor on 4 L of oxygen. 4/19, gradually improving.  Patient is needing 2 to 3 L of supplemental oxygen on mobility.  Assessment & Plan of care:   # Sepsis present on admission secondary to E. coli bacteremia/aspiration pneumonia: Acute hypoxemic respiratory  failure. Urine culture, 50,000 colonies of E. coli. Blood cultures, pansensitive E. Coli. Chest x-ray with right lower lobe pneumonia Initially treated with broad-spectrum antibiotics, and subsequently with ceftriaxone. ID recommended total 1 week of treatment, 2 more days of amoxicillin.  Clinically improved. Seen by speech, on regular diet.  All-time aspiration precautions.  Keep on oxygen to keep saturations more than 90%.  CT scan of the abdomen pelvis 4/14 with no evidence of intra-abdominal pathology, no urinary retention.  # Acute kidney injury: Resolved.  On intermittent Lasix.  # Paroxysmal atrial fibrillation with RVR: All rate control medications were discontinued due to low blood pressure. Blood pressures improved now.  Metoprolol increased to 75 mg twice a day.  On Toprol-XL 100 mg at home. Heart rate still suboptimally controlled, resume diltiazem 120 mg that she takes at home. She is therapeutic on Eliquis. Her acute illness exacerbated her heart rate.  Now improved. She will go back on Toprol-XL 100 mg daily, Cardizem 120 mg daily.  # Demand ischemia in a patient with history of coronary artery disease, aortic stenosis status post TAVR: Troponin peaked to 720.  Followed by cardiology.  Recommended conservative management.  Currently remains on beta-blockers, statin, therapeutic on Eliquis.  Patient is on Eliquis, probably does not need additional aspirin.  Will discontinue. Reviewed patient's cardiology discharge summary from [facility] that shows only Eliquis.  # Essential hypertension: Blood pressures are adequate today.  Resume home medications.   # Type 2 diabetes: Patient on glipizide and insulin at home.  Currently remains on reduced dose of insulin and blood sugars are stable. Patient is on high-dose insulin 72 units at night, she has been using Lantus 10 units twice a day along with sliding  scale insulin and blood sugars are acceptable.  With her clinical  improvement and improvement on her appetite, she may continue to require higher doses, however this time we will keep on 20 units of long-acting insulin.  Resume glipizide. May need to uptitrate her insulin doses.  Patient is medically stabilized to transfer to skilled level of care.  Discharge Diagnoses:  Principal Problem:   Severe sepsis (HCC) Active Problems:   Acute febrile illness   SIRS (systemic inflammatory response syndrome) (HCC)   ARF (acute renal failure) (HCC)   PAF (paroxysmal atrial fibrillation) (HCC)   Acute respiratory failure with hypoxia and hypercapnia (HCC)    Discharge Instructions  Discharge Instructions    Call MD for:  temperature >100.4   Complete by: As directed    Diet - low sodium heart healthy   Complete by: As directed    Diet Carb Modified   Complete by: As directed    Discharge instructions   Complete by: As directed    Use oxygen to keep saturations more than 90%. Continue to closely monitor blood sugars and titrate insulin doses.  You have been using less insulin doses than you were at home.   Increase activity slowly   Complete by: As directed      Allergies as of 10/04/2020      Reactions   Doxycycline Anaphylaxis   Aspirin Other (See Comments)   Advised not to use due to family hx of macular degeneration Advised not to use due to family hx of macular degeneration   Codeine    Tramadol    Ace Inhibitors Rash   Hydrocodone Nausea And Vomiting   Hydrocodone-acetaminophen Nausea And Vomiting   Oxycodone-acetaminophen Nausea And Vomiting   Propoxyphene Nausea And Vomiting   Statins Rash   Sulfa Antibiotics Rash      Medication List    STOP taking these medications   amoxicillin 500 MG capsule Commonly known as: AMOXIL Replaced by: amoxicillin 500 MG tablet   aspirin 81 MG chewable tablet   cephALEXin 500 MG capsule Commonly known as: KEFLEX     TAKE these medications   albuterol 108 (90 Base) MCG/ACT inhaler Commonly  known as: VENTOLIN HFA Inhale 2 puffs into the lungs every 6 (six) hours as needed for wheezing or shortness of breath.   amoxicillin 500 MG tablet Commonly known as: AMOXIL Take 1 tablet (500 mg total) by mouth 3 (three) times daily for 2 days. Replaces: amoxicillin 500 MG capsule   calcium-vitamin D 500-200 MG-UNIT Tabs tablet Commonly known as: OSCAL WITH D Take 1 tablet by mouth daily.   diltiazem 120 MG 24 hr capsule Commonly known as: CARDIZEM CD Take 120 mg by mouth daily.   Eliquis 5 MG Tabs tablet Generic drug: apixaban Take 5 mg by mouth 2 (two) times daily.   ezetimibe 10 MG tablet Commonly known as: ZETIA Take 10 mg by mouth daily.   furosemide 40 MG tablet Commonly known as: LASIX Take 40 mg by mouth daily.   gabapentin 300 MG capsule Commonly known as: NEURONTIN Take 300 mg by mouth in the morning and at bedtime.   glipiZIDE 5 MG 24 hr tablet Commonly known as: GLUCOTROL XL Take 5 mg by mouth daily.   Melatonin 5 MG Caps Take 5 mg by mouth at bedtime.   metoprolol succinate 100 MG 24 hr tablet Commonly known as: TOPROL-XL Take 100 mg by mouth daily.   Ozempic (0.25 or 0.5 MG/DOSE) 2 MG/1.5ML Sopn Generic drug:  Semaglutide(0.25 or 0.5MG /DOS) Inject 0.5 mg into the skin every Monday.   pantoprazole 40 MG tablet Commonly known as: PROTONIX Take 40 mg by mouth daily.   spironolactone 25 MG tablet Commonly known as: ALDACTONE Take 25 mg by mouth in the morning and at bedtime.   Toujeo Max SoloStar 300 UNIT/ML Solostar Pen Generic drug: insulin glargine (2 Unit Dial) Inject 20 Units into the skin at bedtime. What changed: how much to take       Contact information for after-discharge care    Destination    [facility] .   Service: Skilled Nursing Contact information: [address] [place] [address] [phone]                 Allergies  Allergen Reactions  . Doxycycline Anaphylaxis   . Aspirin Other (See Comments)    Advised not to use due to family hx of macular degeneration Advised not to use due to family hx of macular degeneration   . Codeine   . Tramadol   . Ace Inhibitors Rash  . Hydrocodone Nausea And Vomiting  . Hydrocodone-Acetaminophen Nausea And Vomiting  . Oxycodone-Acetaminophen Nausea And Vomiting  . Propoxyphene Nausea And Vomiting  . Statins Rash  . Sulfa Antibiotics Rash    Consultations:  Cardiology  Critical care   Procedures/Studies: DG Chest Portable 1 View  Result Date: 09/30/2020 CLINICAL DATA:  86 year old female with weakness, shortness of breath, nausea vomiting and diarrhea. Decreased p.o. EXAM: PORTABLE CHEST 1 VIEW COMPARISON:  Portable chest 09/30/2020 and earlier. FINDINGS: Portable AP semi upright view at 0638 hours. Rotated to the right today. Chronic elevation of the left hemidiaphragm and cardiomegaly appear not significantly changed. No pneumothorax or pulmonary edema. No definite pleural effusion. Sequelae of TAVR. Patchy new right lower lung opacity versus artifact on this rotated image. No acute osseous abnormality identified. Visible bowel-gas pattern within normal limits. IMPRESSION: 1. Rotated to the right today with questionable new patchy right lung base opacity. Consider bronchopneumonia or aspiration. PA and lateral views of the chest would be helpful when feasible. 2. Otherwise stable chest with cardiomegaly, prior TAVR, chronic left lung base hypo ventilation. Electronically Signed   By: Odessa Fleming M.D.   On: 09/30/2020 06:51   DG CHEST PORT 1 VIEW  Result Date: 09/30/2020 CLINICAL DATA:  Weakness, nausea, vomiting EXAM: PORTABLE CHEST 1 VIEW COMPARISON:  09/29/2020 FINDINGS: Small left pleural effusion is present with associated left basilar atelectasis. Lungs are otherwise clear. No pneumothorax. No pleural effusion on the right. Mild to moderate cardiomegaly is unchanged. Transcatheter aortic valve replacement has  been performed. The pulmonary vascularity is normal. IMPRESSION: No active disease. Stable cardiomegaly and small left pleural effusion. Electronically Signed   By: Helyn Numbers MD   On: 09/30/2020 05:16   DG Chest Portable 1 View  Result Date: 09/29/2020 CLINICAL DATA:  Weakness, hypoxemia, nausea, vomiting and diarrhea the EXAM: PORTABLE CHEST 1 VIEW COMPARISON:  CT and radiograph 04/01/2020 FINDINGS: Chronic elevation left hemidiaphragm. Some hazy opacities present in the lung bases with a mid to lower lung gradient. Slightly congested pulmonary vascularity. No pneumothorax. Left basilar pleural thickening is similar to prior likely reflects chronic effusion. Cardiomegaly with transcatheter aortic valve replacement in stable position. The aorta is calcified. The remaining cardiomediastinal contours are unremarkable. Telemetry leads overlie the chest. The osseous structures appear diffusely demineralized which may limit detection of small or nondisplaced fractures. Degenerative changes are present in the imaged spine  and shoulders. No other acute osseous or soft tissue abnormality. IMPRESSION: 1. Chronic elevation left hemidiaphragm 2. Left basilar pleural thickening, likely chronic effusion. 3. Pulmonary vascular congestion with basilar opacity likely reflecting atelectasis and/or mild edema. 4. Stable cardiomegaly.  Prior TAVR in stable position. Electronically Signed   By: Kreg Shropshire M.D.   On: 09/29/2020 20:03   DG Swallowing Func-Speech Pathology  Result Date: 10/01/2020 Objective Swallowing Evaluation: Type of Study: MBS-Modified Barium Swallow Study  Patient Details Name: Lehua Flores MRN: [id number] Date of Birth: 06-21-1934 Today's Date: 10/01/2020 Time: SLP Start Time (ACUTE ONLY): 1125 -SLP Stop Time (ACUTE ONLY): 1133 SLP Time Calculation (min) (ACUTE ONLY): 8 min Past Medical History: Past Medical History: Diagnosis Date . Atrial fibrillation (HCC)  . AV block  . CAD (coronary artery  disease)  . Congestive heart failure (CHF) (HCC)  . COPD (chronic obstructive pulmonary disease) (HCC)  . Diabetes (HCC)  . GERD (gastroesophageal reflux disease)  . Hypertension  . Left bundle branch block  . Localized edema  . TIA (transient ischemic attack)  . Vitamin D deficiency  Past Surgical History: Past Surgical History: Procedure Laterality Date . heart valve   . REPLACEMENT TOTAL KNEE BILATERAL   HPI: 86 year old female presenting from independent living with complaints of one day history of nausea, vomiting, weakness, and poor PO intake.  Appears to be recently seen for a pruritic rash on her upper chest (ongoing for 2 months) and recently treated for UTI with keflex on 3/4. Admitted for sepsis to [facility].  She developed further respiratory distress with increased work of breathing and encephalopathic and was placed on BiPAP and given lasix 40mg .  Follow up CXR questions new patchy right lung opacity, concerning for aspiration.  Subjective: Pt awake, alert, pleasant, participative Assessment / Plan / Recommendation CHL IP CLINICAL IMPRESSIONS 10/01/2020 Clinical Impression Pt presents with functional oropharyngeal swallowing.  There was transient penetration of thin liquid during pill simulation only.  There was brief oral stasis of tablet.  Pt used a head jerk to assist in transit of tablet.  This resulted in premature spillage of thin liquid wash into the laryngeal vestibule, which was fully cleared during swallow.  Pt denies that she has difficulty taking medications typically, and RN reported no difficulty with today's medications.  Should pt have difficulty swallowing pills, consider giving 1 at a time, or whole with puree.  Recommend regular texture diet with thin liquids.  Pt has no further ST needs at this time; SLP will sign off. SLP Visit Diagnosis Dysphagia, unspecified (R13.10) Attention and concentration deficit following -- Frontal lobe and executive function deficit following -- Impact on  safety and function No limitations   CHL IP TREATMENT RECOMMENDATION 10/01/2020 Treatment Recommendations No treatment recommended at this time   Prognosis 10/01/2020 Prognosis for Safe Diet Advancement (No Data) Barriers to Reach Goals -- Barriers/Prognosis Comment -- CHL IP DIET RECOMMENDATION 10/01/2020 SLP Diet Recommendations Regular solids;Thin liquid Liquid Administration via Cup;Straw Medication Administration Whole meds with liquid Compensations -- Postural Changes --   CHL IP OTHER RECOMMENDATIONS 10/01/2020 Recommended Consults -- Oral Care Recommendations Oral care BID Other Recommendations --   CHL IP FOLLOW UP RECOMMENDATIONS 10/01/2020 Follow up Recommendations None   CHL IP FREQUENCY AND DURATION 10/01/2020 Speech Therapy Frequency (ACUTE ONLY) (No Data) Treatment Duration --      CHL IP ORAL PHASE 10/01/2020 Oral Phase WFL Oral - Pudding Teaspoon -- Oral - Pudding Cup -- Oral - Honey Teaspoon -- Oral -  Honey Cup -- Oral - Nectar Teaspoon -- Oral - Nectar Cup -- Oral - Nectar Straw -- Oral - Thin Teaspoon -- Oral - Thin Cup WFL Oral - Thin Straw WFL Oral - Puree WFL Oral - Mech Soft -- Oral - Regular WFL Oral - Multi-Consistency -- Oral - Pill Reduced posterior propulsion Oral Phase - Comment --  CHL IP PHARYNGEAL PHASE 10/01/2020 Pharyngeal Phase WFL Pharyngeal- Pudding Teaspoon -- Pharyngeal -- Pharyngeal- Pudding Cup -- Pharyngeal -- Pharyngeal- Honey Teaspoon -- Pharyngeal -- Pharyngeal- Honey Cup -- Pharyngeal -- Pharyngeal- Nectar Teaspoon -- Pharyngeal -- Pharyngeal- Nectar Cup -- Pharyngeal -- Pharyngeal- Nectar Straw -- Pharyngeal -- Pharyngeal- Thin Teaspoon -- Pharyngeal -- Pharyngeal- Thin Cup [facility] Pharyngeal Material enters airway, remains ABOVE vocal cords then ejected out Pharyngeal- Thin Straw WFL Pharyngeal Material does not enter airway Pharyngeal- Puree WFL Pharyngeal Material does not enter airway Pharyngeal- Mechanical Soft -- Pharyngeal -- Pharyngeal- Regular WFL Pharyngeal Material does  not enter airway Pharyngeal- Multi-consistency -- Pharyngeal -- Pharyngeal- Pill WFL Pharyngeal Material enters airway, remains ABOVE vocal cords then ejected out Pharyngeal Comment --  CHL IP CERVICAL ESOPHAGEAL PHASE 10/01/2020 Cervical Esophageal Phase WFL Pudding Teaspoon -- Pudding Cup -- Honey Teaspoon -- Honey Cup -- Nectar Teaspoon -- Nectar Cup -- Nectar Straw -- Thin Teaspoon -- Thin Cup -- Thin Straw -- Puree -- Mechanical Soft -- Regular -- Multi-consistency -- Pill -- Cervical Esophageal Comment -- Leigh E Borum 10/01/2020, 11:59 AM              ECHOCARDIOGRAM COMPLETE  Result Date: 09/30/2020    ECHOCARDIOGRAM REPORT   Patient Name:   [facility] Date of Exam: 09/30/2020 Medical Rec #:  [id number]         Height:       64.0 in Accession #:    [id number]        Weight:       150.0 lb Date of Birth:  1935-03-17         BSA:          1.731 m Patient Age:    85 years          BP:           110/56 mmHg Patient Gender: F                 HR:           98 bpm. Exam Location:  Inpatient Procedure: 2D Echo, Cardiac Doppler and Color Doppler Indications:    CHF  History:        Patient has no prior history of Echocardiogram examinations.                 Pulmonary HTN, Aortic Valve Disease and Mitral Valve Disease,                 Arrythmias:Atrial Fibrillation; Risk Factors:Former Smoker. S/P                 bioprosthetic aortic valve 05/2020.                 Aortic Valve: 23 mm Sapien prosthetic, stented (TAVR) valve is                 present in the aortic position. Procedure Date: 06/03/20.  Sonographer:    Ross Ludwig RDCS (AE) Referring Phys: [id number] PAULA B SIMPSON IMPRESSIONS  1. Left ventricular ejection fraction, by estimation, is 60 to 65%. The left ventricle  has normal function. The left ventricle has no regional wall motion abnormalities. There is moderate concentric left ventricular hypertrophy. Left ventricular diastolic function could not be evaluated. Elevated left ventricular end-diastolic  pressure.  2. Right ventricular systolic function was not well visualized. The right ventricular size is not well visualized.  3. Left atrial size was severely dilated.  4. Right atrial size was mildly dilated.  5. The mitral valve is rheumatic. Trivial mitral valve regurgitation. Moderate mitral stenosis. The mean mitral valve gradient is 6.0 mmHg. Moderate to severe mitral annular calcification.  6. The aortic valve has been repaired/replaced. Aortic valve regurgitation not well visualized. There is a 23 mm Sapien prosthetic (TAVR) valve present in the aortic position. Procedure Date: 06/03/20. Aortic valve mean gradient measures 13.0 mmHg.  7. The inferior vena cava is dilated in size with <50% respiratory variability, suggesting right atrial pressure of 15 mmHg. Comparison(s): Prior images unable to be directly viewed, comparison made by report only. FINDINGS  Left Ventricle: Left ventricular ejection fraction, by estimation, is 60 to 65%. The left ventricle has normal function. The left ventricle has no regional wall motion abnormalities. The left ventricular internal cavity size was normal in size. There is  moderate concentric left ventricular hypertrophy. Left ventricular diastolic function could not be evaluated due to mitral annular calcification (moderate or greater). Left ventricular diastolic function could not be evaluated. Elevated left ventricular  end-diastolic pressure. Right Ventricle: The right ventricular size is not well visualized. Right vetricular wall thickness was not well visualized. Right ventricular systolic function was not well visualized. Left Atrium: Left atrial size was severely dilated. Right Atrium: Right atrial size was mildly dilated. Pericardium: There is no evidence of pericardial effusion. Mitral Valve: The mitral valve is rheumatic. Moderate to severe mitral annular calcification. Trivial mitral valve regurgitation. Moderate mitral valve stenosis. MV peak gradient, 12.2  mmHg. The mean mitral valve gradient is 6.0 mmHg. Tricuspid Valve: The tricuspid valve is grossly normal. Tricuspid valve regurgitation is trivial. Aortic Valve: The aortic valve has been repaired/replaced. Aortic valve regurgitation not well visualized. Aortic valve mean gradient measures 13.0 mmHg. Aortic valve peak gradient measures 24.7 mmHg. Aortic valve area, by VTI measures 2.15 cm. There is  a 23 mm Sapien prosthetic, stented (TAVR) valve present in the aortic position. Procedure Date: 06/03/20. Pulmonic Valve: The pulmonic valve was not well visualized. Pulmonic valve regurgitation is not visualized. Aorta: The aortic root was not well visualized, the ascending aorta was not well visualized and the aortic arch was not well visualized. Venous: The inferior vena cava is dilated in size with less than 50% respiratory variability, suggesting right atrial pressure of 15 mmHg. IAS/Shunts: The atrial septum is grossly normal.  LEFT VENTRICLE PLAX 2D LVIDd:         4.80 cm  Diastology LVIDs:         3.10 cm  LV e' lateral:   4.46 cm/s LV PW:         1.30 cm  LV E/e' lateral: 37.4 LV IVS:        1.40 cm LVOT diam:     2.40 cm LV SV:         86 LV SV Index:   49 LVOT Area:     4.52 cm  RIGHT VENTRICLE            IVC RV Basal diam:  3.40 cm    IVC diam: 2.50 cm RV S prime:     8.59 cm/s TAPSE (  M-mode): 2.0 cm LEFT ATRIUM           Index       RIGHT ATRIUM           Index LA diam:      3.60 cm 2.08 cm/m  RA Area:     13.90 cm LA Vol (A2C): 57.2 ml 33.04 ml/m RA Volume:   35.00 ml  20.22 ml/m LA Vol (A4C): 78.2 ml 45.17 ml/m  AORTIC VALVE AV Area (Vmax):    1.70 cm AV Area (Vmean):   1.76 cm AV Area (VTI):     2.15 cm AV Vmax:           248.33 cm/s AV Vmean:          166.667 cm/s AV VTI:            0.398 m AV Peak Grad:      24.7 mmHg AV Mean Grad:      13.0 mmHg LVOT Vmax:         93.10 cm/s LVOT Vmean:        64.800 cm/s LVOT VTI:          0.189 m LVOT/AV VTI ratio: 0.48  AORTA Ao Root diam: 2.60 cm Ao Asc  diam:  2.80 cm MITRAL VALVE MV Area (PHT): 3.89 cm     SHUNTS MV Area VTI:   2.13 cm     Systemic VTI:  0.19 m MV Peak grad:  12.2 mmHg    Systemic Diam: 2.40 cm MV Mean grad:  6.0 mmHg MV Vmax:       1.75 m/s MV Vmean:      112.0 cm/s MV Decel Time: 195 msec MV E velocity: 167.00 cm/s MV A velocity: 160.00 cm/s MV E/A ratio:  1.04 Jodelle Red MD Electronically signed by Jodelle Red MD Signature Date/Time: 09/30/2020/5:41:36 PM    Final    CT RENAL STONE STUDY  Result Date: 09/30/2020 CLINICAL DATA:  Weakness, nausea, vomiting, diarrhea EXAM: CT ABDOMEN AND PELVIS WITHOUT CONTRAST TECHNIQUE: Multidetector CT imaging of the abdomen and pelvis was performed following the standard protocol without IV contrast. COMPARISON:  None. FINDINGS: Lower chest: There is elevation of the left hemidiaphragm. The visualized right lung base is unremarkable Hepatobiliary: The visualized liver is unremarkable. Cholelithiasis noted. No pericholecystic inflammatory change. No intra or extrahepatic biliary ductal dilation. Pancreas: Unremarkable Spleen: The visualized spleen is unremarkable. Adrenals/Urinary Tract: Adrenal glands are unremarkable. Kidneys are normal, without renal calculi, focal lesion, or hydronephrosis. Bladder is unremarkable. Stomach/Bowel: Evaluation of the bowel is limited by motion artifact. The stomach, small bowel, and large bowel, however, Vascular/Lymphatic: Extensive aortoiliac atherosclerotic calcification. No aortic aneurysm. Circumaortic left renal vein. No pathologic adenopathy within the abdomen and pelvis. Reproductive: Uterus and bilateral adnexa are unremarkable. Other: No abdominal wall hernia.  Rectum unremarkable. Musculoskeletal: Bilateral hip ORIF has been performed with dynamic hip screws noted. Degenerative changes are seen within the lumbar spine. No acute bone abnormality. IMPRESSION: Limited examination without evidence of acute intra-abdominal pathology. No definite  radiographic explanation for the patient's reported symptoms. Cholelithiasis. Distal colonic diverticulosis Aortic Atherosclerosis (ICD10-I70.0). Electronically Signed   By: Helyn Numbers MD   On: 09/30/2020 05:37   (Echo, Carotid, EGD, Colonoscopy, ERCP)    Subjective: Patient seen and examined.  Today denies any complaints.  She got enough sleep last night.  Eager to go back to [employer].   Discharge Exam: Vitals:   10/03/20 1943 10/04/20 0455  BP: (!) 147/68  138/78  Pulse: 87 90  Resp: 19 20  Temp: 97.6 F (36.4 C) 97.6 F (36.4 C)  SpO2: 96% 98%   Vitals:   10/03/20 1000 10/03/20 1554 10/03/20 1943 10/04/20 0455  BP:  (!) 150/80 (!) 147/68 138/78  Pulse: 98 96 87 90  Resp:  16 19 20   Temp:  98.6 F (37 C) 97.6 F (36.4 C) 97.6 F (36.4 C)  TempSrc:    Axillary  SpO2: 97% 100% 96% 98%  Weight:      Height:        General: Pt is alert, awake, not in acute distress Patient is on 2 to 3 L of oxygen and looks fairly comfortable at rest. Cardiovascular: Irregularly irregular. S1/S2 +, no rubs, no gallops Respiratory: CTA bilaterally, no wheezing, no rhonchi Abdominal: Soft, NT, ND, bowel sounds + Extremities: no edema, no cyanosis    The results of significant diagnostics from this hospitalization (including imaging, microbiology, ancillary and laboratory) are listed below for reference.     Microbiology: Recent Results (from the past 240 hour(s))  Resp Panel by RT-PCR (Flu A&B, Covid) Nasopharyngeal Swab     Status: None   Collection Time: 09/29/20  7:22 PM   Specimen: Nasopharyngeal Swab; Nasopharyngeal(NP) swabs in vial transport medium  Result Value Ref Range Status   SARS Coronavirus 2 by RT PCR NEGATIVE NEGATIVE Final    Comment: (NOTE) SARS-CoV-2 target nucleic acids are NOT DETECTED.  The SARS-CoV-2 RNA is generally detectable in upper respiratory specimens during the acute phase of infection. The lowest concentration of SARS-CoV-2 viral copies  this assay can detect is 138 copies/mL. A negative result does not preclude SARS-Cov-2 infection and should not be used as the sole basis for treatment or other patient management decisions. A negative result may occur with  improper specimen collection/handling, submission of specimen other than nasopharyngeal swab, presence of viral mutation(s) within the areas targeted by this assay, and inadequate number of viral copies(<138 copies/mL). A negative result must be combined with clinical observations, patient history, and epidemiological information. The expected result is Negative.  Fact Sheet for Patients:  BloggerCourse.comhttps://www.fda.gov/media/152166/download  Fact Sheet for Healthcare Providers:  SeriousBroker.ithttps://www.fda.gov/media/152162/download  This test is no t yet approved or cleared by the [place] FDA and  has been authorized for detection and/or diagnosis of SARS-CoV-2 by FDA under an Emergency Use Authorization (EUA). This EUA will remain  in effect (meaning this test can be used) for the duration of the COVID-19 declaration under Section 564(b)(1) of the Act, 21 U.S.C.section 360bbb-3(b)(1), unless the authorization is terminated  or revoked sooner.       Influenza A by PCR NEGATIVE NEGATIVE Final   Influenza B by PCR NEGATIVE NEGATIVE Final    Comment: (NOTE) The Xpert Xpress SARS-CoV-2/FLU/RSV plus assay is intended as an aid in the diagnosis of influenza from Nasopharyngeal swab specimens and should not be used as a sole basis for treatment. Nasal washings and aspirates are unacceptable for Xpert Xpress SARS-CoV-2/FLU/RSV testing.  Fact Sheet for Patients: BloggerCourse.comhttps://www.fda.gov/media/152166/download  Fact Sheet for Healthcare Providers: SeriousBroker.ithttps://www.fda.gov/media/152162/download  This test is not yet approved or cleared by the [place] FDA and has been authorized for detection and/or diagnosis of SARS-CoV-2 by FDA under an Emergency Use Authorization (EUA). This EUA  will remain in effect (meaning this test can be used) for the duration of the COVID-19 declaration under Section 564(b)(1) of the Act, 21 U.S.C. section 360bbb-3(b)(1), unless the authorization is terminated or revoked.  Performed at [facility], [address], [place], [place] [address]   Culture, blood (routine x 2)     Status: Abnormal   Collection Time: 09/29/20  9:10 PM   Specimen: BLOOD  Result Value Ref Range Status   Specimen Description BLOOD RIGHT ANTECUBITAL  Final   Special Requests   Final    BOTTLES DRAWN AEROBIC AND ANAEROBIC Blood Culture adequate volume   Culture  Setup Time   Final    GRAM NEGATIVE RODS IN BOTH AEROBIC AND ANAEROBIC BOTTLES PHARMD E.WOLFE AT 1256 ON 09/30/2020 BY T.SAAD Performed at [facility], [address] [place], [place] [address]    Culture ESCHERICHIA COLI (A)  Final   Report Status 10/02/2020 FINAL  Final   Organism ID, Bacteria ESCHERICHIA COLI  Final      Susceptibility   Escherichia coli - MIC*    AMPICILLIN <=2 SENSITIVE Sensitive     CEFAZOLIN <=4 SENSITIVE Sensitive     CEFEPIME <=0.12 SENSITIVE Sensitive     CEFTAZIDIME <=1 SENSITIVE Sensitive     CEFTRIAXONE <=0.25 SENSITIVE Sensitive     CIPROFLOXACIN 0.5 SENSITIVE Sensitive     GENTAMICIN <=1 SENSITIVE Sensitive     IMIPENEM <=0.25 SENSITIVE Sensitive     TRIMETH/SULFA <=20 SENSITIVE Sensitive     AMPICILLIN/SULBACTAM <=2 SENSITIVE Sensitive     PIP/TAZO <=4 SENSITIVE Sensitive     * ESCHERICHIA COLI  Blood Culture ID Panel (Reflexed)     Status: Abnormal   Collection Time: 09/29/20  9:10 PM  Result Value Ref Range Status   Enterococcus faecalis NOT DETECTED NOT DETECTED Final   Enterococcus Faecium NOT DETECTED NOT DETECTED Final   Listeria monocytogenes NOT DETECTED NOT DETECTED Final   Staphylococcus species NOT DETECTED NOT DETECTED Final   Staphylococcus aureus (BCID) NOT DETECTED NOT DETECTED Final   Staphylococcus epidermidis NOT DETECTED NOT  DETECTED Final   Staphylococcus lugdunensis NOT DETECTED NOT DETECTED Final   Streptococcus species NOT DETECTED NOT DETECTED Final   Streptococcus agalactiae NOT DETECTED NOT DETECTED Final   Streptococcus pneumoniae NOT DETECTED NOT DETECTED Final   Streptococcus pyogenes NOT DETECTED NOT DETECTED Final   A.calcoaceticus-baumannii NOT DETECTED NOT DETECTED Final   Bacteroides fragilis NOT DETECTED NOT DETECTED Final   Enterobacterales DETECTED (A) NOT DETECTED Final    Comment: Enterobacterales represent a large order of gram negative bacteria, not a single organism. CRITICAL RESULT CALLED TO, READ BACK BY AND VERIFIED WITH: PHARMD E.WOLFE AT 1256 ON 09/30/2020 BY T.SAAD.    Enterobacter cloacae complex NOT DETECTED NOT DETECTED Final   Escherichia coli DETECTED (A) NOT DETECTED Final    Comment: CRITICAL RESULT CALLED TO, READ BACK BY AND VERIFIED WITH: PHARMD E.WOLFE AT 1256 ON 09/30/2020 BY T.SAAD.    Klebsiella aerogenes NOT DETECTED NOT DETECTED Final   Klebsiella oxytoca NOT DETECTED NOT DETECTED Final   Klebsiella pneumoniae NOT DETECTED NOT DETECTED Final   Proteus species NOT DETECTED NOT DETECTED Final   Salmonella species NOT DETECTED NOT DETECTED Final   Serratia marcescens NOT DETECTED NOT DETECTED Final   Haemophilus influenzae NOT DETECTED NOT DETECTED Final   Neisseria meningitidis NOT DETECTED NOT DETECTED Final   Pseudomonas aeruginosa NOT DETECTED NOT DETECTED Final   Stenotrophomonas maltophilia NOT DETECTED NOT DETECTED Final   Candida albicans NOT DETECTED NOT DETECTED Final   Candida auris NOT DETECTED NOT DETECTED Final   Candida glabrata NOT DETECTED NOT DETECTED Final   Candida krusei NOT DETECTED NOT DETECTED Final  Candida parapsilosis NOT DETECTED NOT DETECTED Final   Candida tropicalis NOT DETECTED NOT DETECTED Final   Cryptococcus neoformans/gattii NOT DETECTED NOT DETECTED Final   CTX-M ESBL NOT DETECTED NOT DETECTED Final   Carbapenem resistance  IMP NOT DETECTED NOT DETECTED Final   Carbapenem resistance KPC NOT DETECTED NOT DETECTED Final   Carbapenem resistance NDM NOT DETECTED NOT DETECTED Final   Carbapenem resist OXA 48 LIKE NOT DETECTED NOT DETECTED Final   Carbapenem resistance VIM NOT DETECTED NOT DETECTED Final    Comment: Performed at [facility], [address], [place], [place] [address]  Culture, blood (routine x 2)     Status: None   Collection Time: 09/29/20  9:17 PM   Specimen: BLOOD LEFT HAND  Result Value Ref Range Status   Specimen Description BLOOD LEFT HAND  Final   Special Requests   Final    BOTTLES DRAWN AEROBIC AND ANAEROBIC Blood Culture results may not be optimal due to an inadequate volume of blood received in culture bottles   Culture   Final    NO GROWTH 5 DAYS Performed at [facility], [address], [place], [place] [address]    Report Status 10/04/2020 FINAL  Final  Culture, Urine     Status: Abnormal   Collection Time: 09/29/20  9:56 PM   Specimen: Urine, Random  Result Value Ref Range Status   Specimen Description URINE, RANDOM  Final   Special Requests   Final    NONE Performed at [facility], [address], [place], [place] [address]    Culture 50,000 COLONIES/mL ESCHERICHIA COLI (A)  Final   Report Status 10/03/2020 FINAL  Final   Organism ID, Bacteria ESCHERICHIA COLI (A)  Final      Susceptibility   Escherichia coli - MIC*    AMPICILLIN <=2 SENSITIVE Sensitive     CEFAZOLIN <=4 SENSITIVE Sensitive     CEFEPIME <=0.12 SENSITIVE Sensitive     CEFTRIAXONE <=0.25 SENSITIVE Sensitive     CIPROFLOXACIN 0.5 SENSITIVE Sensitive     GENTAMICIN <=1 SENSITIVE Sensitive     IMIPENEM <=0.25 SENSITIVE Sensitive     NITROFURANTOIN <=16 SENSITIVE Sensitive     TRIMETH/SULFA <=20 SENSITIVE Sensitive     AMPICILLIN/SULBACTAM <=2 SENSITIVE Sensitive     PIP/TAZO <=4 SENSITIVE Sensitive     * 50,000 COLONIES/mL ESCHERICHIA COLI  MRSA PCR Screening     Status: None    Collection Time: 09/30/20 10:00 AM   Specimen: Nasopharyngeal  Result Value Ref Range Status   MRSA by PCR NEGATIVE NEGATIVE Final    Comment:        The GeneXpert MRSA Assay (FDA approved for NASAL specimens only), is one component of a comprehensive MRSA colonization surveillance program. It is not intended to diagnose MRSA infection nor to guide or monitor treatment for MRSA infections. Performed at [facility], [address], [place], [place] [address]   SARS CORONAVIRUS 2 (TAT 6-24 HRS) Nasopharyngeal Nasopharyngeal Swab     Status: None   Collection Time: 10/03/20  4:13 PM   Specimen: Nasopharyngeal Swab  Result Value Ref Range Status   SARS Coronavirus 2 NEGATIVE NEGATIVE Final    Comment: (NOTE) SARS-CoV-2 target nucleic acids are NOT DETECTED.  The SARS-CoV-2 RNA is generally detectable in upper and lower respiratory specimens during the acute phase of infection. Negative results do not preclude SARS-CoV-2 infection, do not rule out co-infections with other pathogens, and should not  be used as the sole basis for treatment or other patient management decisions. Negative results must be combined with clinical observations, patient history, and epidemiological information. The expected result is Negative.  Fact Sheet for Patients: HairSlick.no  Fact Sheet for Healthcare Providers: quierodirigir.com  This test is not yet approved or cleared by the [place] FDA and  has been authorized for detection and/or diagnosis of SARS-CoV-2 by FDA under an Emergency Use Authorization (EUA). This EUA will remain  in effect (meaning this test can be used) for the duration of the COVID-19 declaration under Se ction 564(b)(1) of the Act, 21 U.S.C. section 360bbb-3(b)(1), unless the authorization is terminated or revoked sooner.  Performed at [facility], [address] [place], [place] [address]       Labs: BNP (last 3 results) Recent Labs    09/30/20 0453  BNP 384.7*   Basic Metabolic Panel: Recent Labs  Lab 09/29/20 1820 09/30/20 0501 09/30/20 0712 09/30/20 0848 09/30/20 1051 10/01/20 0100 10/02/20 0509 10/03/20 0346  NA 132* 136 135 136  --  138 137 142  K 5.0 4.3 4.1 4.1  --  4.3 4.3 4.1  CL 96* 98  --   --   --  105 102 102  CO2 23 25  --   --   --  25 28 34*  GLUCOSE 214* 206*  --   --   --  198* 207* 123*  BUN 25* 27*  --   --   --  37* 32* 22  CREATININE 1.24* 1.69*  --   --   --  1.76* 1.09* 0.84  CALCIUM 8.3* 8.6*  --   --   --  8.1* 8.6* 9.0  MG  --   --   --   --  1.7  --   --  2.0  PHOS  --   --   --   --   --   --   --  3.1   Liver Function Tests: Recent Labs  Lab 09/29/20 1820 09/30/20 0501  AST 25 19  ALT 15 20  ALKPHOS 99 94  BILITOT 3.4* 1.6*  PROT 5.9* 6.1*  ALBUMIN 2.9* 2.9*   Recent Labs  Lab 09/29/20 1820  LIPASE 26   No results for input(s): AMMONIA in the last 168 hours. CBC: Recent Labs  Lab 09/29/20 1820 09/30/20 0501 09/30/20 0712 09/30/20 0848 10/01/20 0100 10/02/20 0509 10/03/20 0346  WBC 17.3* 18.6*  --   --  19.9* 17.1* 11.0*  NEUTROABS 15.7*  --   --   --   --   --   --   HGB 13.9 13.6 14.3 12.9 11.5* 11.8* 13.0  HCT 42.9 42.2 42.0 38.0 35.9* 36.5 40.9  MCV 95.8 98.4  --   --  96.8 97.9 97.6  PLT 200 201  --   --  163 152 164   Cardiac Enzymes: No results for input(s): CKTOTAL, CKMB, CKMBINDEX, TROPONINI in the last 168 hours. BNP: Invalid input(s): POCBNP CBG: Recent Labs  Lab 10/03/20 1607 10/03/20 2033 10/04/20 0102 10/04/20 0732 10/04/20 1156  GLUCAP 154* 143* 245* 229* 216*   D-Dimer No results for input(s): DDIMER in the last 72 hours. Hgb A1c No results for input(s): HGBA1C in the last 72 hours. Lipid Profile No results for input(s): CHOL, HDL, LDLCALC, TRIG, CHOLHDL, LDLDIRECT in the last 72 hours. Thyroid function studies No results for input(s): TSH, T4TOTAL, T3FREE, THYROIDAB in the  last 72  hours.  Invalid input(s): FREET3 Anemia work up No results for input(s): VITAMINB12, FOLATE, FERRITIN, TIBC, IRON, RETICCTPCT in the last 72 hours. Urinalysis    Component Value Date/Time   COLORURINE AMBER (A) 09/29/2020 2155   APPEARANCEUR CLOUDY (A) 09/29/2020 2155   LABSPEC 1.014 09/29/2020 2155   PHURINE 5.0 09/29/2020 2155   GLUCOSEU NEGATIVE 09/29/2020 2155   HGBUR MODERATE (A) 09/29/2020 2155   BILIRUBINUR NEGATIVE 09/29/2020 2155   KETONESUR 5 (A) 09/29/2020 2155   PROTEINUR 100 (A) 09/29/2020 2155   NITRITE NEGATIVE 09/29/2020 2155   LEUKOCYTESUR LARGE (A) 09/29/2020 2155   Sepsis Labs Invalid input(s): PROCALCITONIN,  WBC,  LACTICIDVEN Microbiology Recent Results (from the past 240 hour(s))  Resp Panel by RT-PCR (Flu A&B, Covid) Nasopharyngeal Swab     Status: None   Collection Time: 09/29/20  7:22 PM   Specimen: Nasopharyngeal Swab; Nasopharyngeal(NP) swabs in vial transport medium  Result Value Ref Range Status   SARS Coronavirus 2 by RT PCR NEGATIVE NEGATIVE Final    Comment: (NOTE) SARS-CoV-2 target nucleic acids are NOT DETECTED.  The SARS-CoV-2 RNA is generally detectable in upper respiratory specimens during the acute phase of infection. The lowest concentration of SARS-CoV-2 viral copies this assay can detect is 138 copies/mL. A negative result does not preclude SARS-Cov-2 infection and should not be used as the sole basis for treatment or other patient management decisions. A negative result may occur with  improper specimen collection/handling, submission of specimen other than nasopharyngeal swab, presence of viral mutation(s) within the areas targeted by this assay, and inadequate number of viral copies(<138 copies/mL). A negative result must be combined with clinical observations, patient history, and epidemiological information. The expected result is Negative.  Fact Sheet for Patients:  BloggerCourse.com  Fact  Sheet for Healthcare Providers:  SeriousBroker.it  This test is no t yet approved or cleared by the [place] FDA and  has been authorized for detection and/or diagnosis of SARS-CoV-2 by FDA under an Emergency Use Authorization (EUA). This EUA will remain  in effect (meaning this test can be used) for the duration of the COVID-19 declaration under Section 564(b)(1) of the Act, 21 U.S.C.section 360bbb-3(b)(1), unless the authorization is terminated  or revoked sooner.       Influenza A by PCR NEGATIVE NEGATIVE Final   Influenza B by PCR NEGATIVE NEGATIVE Final    Comment: (NOTE) The Xpert Xpress SARS-CoV-2/FLU/RSV plus assay is intended as an aid in the diagnosis of influenza from Nasopharyngeal swab specimens and should not be used as a sole basis for treatment. Nasal washings and aspirates are unacceptable for Xpert Xpress SARS-CoV-2/FLU/RSV testing.  Fact Sheet for Patients: BloggerCourse.com  Fact Sheet for Healthcare Providers: SeriousBroker.it  This test is not yet approved or cleared by the [place] FDA and has been authorized for detection and/or diagnosis of SARS-CoV-2 by FDA under an Emergency Use Authorization (EUA). This EUA will remain in effect (meaning this test can be used) for the duration of the COVID-19 declaration under Section 564(b)(1) of the Act, 21 U.S.C. section 360bbb-3(b)(1), unless the authorization is terminated or revoked.  Performed at [facility], [address] [place], [place] [address]   Culture, blood (routine x 2)     Status: Abnormal   Collection Time: 09/29/20  9:10 PM   Specimen: BLOOD  Result Value Ref Range Status   Specimen Description BLOOD RIGHT ANTECUBITAL  Final   Special Requests   Final    BOTTLES DRAWN AEROBIC AND  ANAEROBIC Blood Culture adequate volume   Culture  Setup Time   Final    GRAM NEGATIVE RODS IN BOTH AEROBIC AND  ANAEROBIC BOTTLES PHARMD E.WOLFE AT 1256 ON 09/30/2020 BY T.SAAD Performed at [facility], [address], [place], [place] [address]    Culture ESCHERICHIA COLI (A)  Final   Report Status 10/02/2020 FINAL  Final   Organism ID, Bacteria ESCHERICHIA COLI  Final      Susceptibility   Escherichia coli - MIC*    AMPICILLIN <=2 SENSITIVE Sensitive     CEFAZOLIN <=4 SENSITIVE Sensitive     CEFEPIME <=0.12 SENSITIVE Sensitive     CEFTAZIDIME <=1 SENSITIVE Sensitive     CEFTRIAXONE <=0.25 SENSITIVE Sensitive     CIPROFLOXACIN 0.5 SENSITIVE Sensitive     GENTAMICIN <=1 SENSITIVE Sensitive     IMIPENEM <=0.25 SENSITIVE Sensitive     TRIMETH/SULFA <=20 SENSITIVE Sensitive     AMPICILLIN/SULBACTAM <=2 SENSITIVE Sensitive     PIP/TAZO <=4 SENSITIVE Sensitive     * ESCHERICHIA COLI  Blood Culture ID Panel (Reflexed)     Status: Abnormal   Collection Time: 09/29/20  9:10 PM  Result Value Ref Range Status   Enterococcus faecalis NOT DETECTED NOT DETECTED Final   Enterococcus Faecium NOT DETECTED NOT DETECTED Final   Listeria monocytogenes NOT DETECTED NOT DETECTED Final   Staphylococcus species NOT DETECTED NOT DETECTED Final   Staphylococcus aureus (BCID) NOT DETECTED NOT DETECTED Final   Staphylococcus epidermidis NOT DETECTED NOT DETECTED Final   Staphylococcus lugdunensis NOT DETECTED NOT DETECTED Final   Streptococcus species NOT DETECTED NOT DETECTED Final   Streptococcus agalactiae NOT DETECTED NOT DETECTED Final   Streptococcus pneumoniae NOT DETECTED NOT DETECTED Final   Streptococcus pyogenes NOT DETECTED NOT DETECTED Final   A.calcoaceticus-baumannii NOT DETECTED NOT DETECTED Final   Bacteroides fragilis NOT DETECTED NOT DETECTED Final   Enterobacterales DETECTED (A) NOT DETECTED Final    Comment: Enterobacterales represent a large order of gram negative bacteria, not a single organism. CRITICAL RESULT CALLED TO, READ BACK BY AND VERIFIED WITH: PHARMD E.WOLFE AT 1256 ON  09/30/2020 BY T.SAAD.    Enterobacter cloacae complex NOT DETECTED NOT DETECTED Final   Escherichia coli DETECTED (A) NOT DETECTED Final    Comment: CRITICAL RESULT CALLED TO, READ BACK BY AND VERIFIED WITH: PHARMD E.WOLFE AT 1256 ON 09/30/2020 BY T.SAAD.    Klebsiella aerogenes NOT DETECTED NOT DETECTED Final   Klebsiella oxytoca NOT DETECTED NOT DETECTED Final   Klebsiella pneumoniae NOT DETECTED NOT DETECTED Final   Proteus species NOT DETECTED NOT DETECTED Final   Salmonella species NOT DETECTED NOT DETECTED Final   Serratia marcescens NOT DETECTED NOT DETECTED Final   Haemophilus influenzae NOT DETECTED NOT DETECTED Final   Neisseria meningitidis NOT DETECTED NOT DETECTED Final   Pseudomonas aeruginosa NOT DETECTED NOT DETECTED Final   Stenotrophomonas maltophilia NOT DETECTED NOT DETECTED Final   Candida albicans NOT DETECTED NOT DETECTED Final   Candida auris NOT DETECTED NOT DETECTED Final   Candida glabrata NOT DETECTED NOT DETECTED Final   Candida krusei NOT DETECTED NOT DETECTED Final   Candida parapsilosis NOT DETECTED NOT DETECTED Final   Candida tropicalis NOT DETECTED NOT DETECTED Final   Cryptococcus neoformans/gattii NOT DETECTED NOT DETECTED Final   CTX-M ESBL NOT DETECTED NOT DETECTED Final   Carbapenem resistance IMP NOT DETECTED NOT DETECTED Final   Carbapenem resistance KPC NOT DETECTED NOT DETECTED Final   Carbapenem resistance NDM NOT DETECTED NOT  DETECTED Final   Carbapenem resist OXA 48 LIKE NOT DETECTED NOT DETECTED Final   Carbapenem resistance VIM NOT DETECTED NOT DETECTED Final    Comment: Performed at [facility], [address] [place], [place] [address]  Culture, blood (routine x 2)     Status: None   Collection Time: 09/29/20  9:17 PM   Specimen: BLOOD LEFT HAND  Result Value Ref Range Status   Specimen Description BLOOD LEFT HAND  Final   Special Requests   Final    BOTTLES DRAWN AEROBIC AND ANAEROBIC Blood Culture results may not be  optimal due to an inadequate volume of blood received in culture bottles   Culture   Final    NO GROWTH 5 DAYS Performed at [facility], [address] [place], [place] [address]    Report Status 10/04/2020 FINAL  Final  Culture, Urine     Status: Abnormal   Collection Time: 09/29/20  9:56 PM   Specimen: Urine, Random  Result Value Ref Range Status   Specimen Description URINE, RANDOM  Final   Special Requests   Final    NONE Performed at [facility], [address] [place], [place] [address]    Culture 50,000 COLONIES/mL ESCHERICHIA COLI (A)  Final   Report Status 10/03/2020 FINAL  Final   Organism ID, Bacteria ESCHERICHIA COLI (A)  Final      Susceptibility   Escherichia coli - MIC*    AMPICILLIN <=2 SENSITIVE Sensitive     CEFAZOLIN <=4 SENSITIVE Sensitive     CEFEPIME <=0.12 SENSITIVE Sensitive     CEFTRIAXONE <=0.25 SENSITIVE Sensitive     CIPROFLOXACIN 0.5 SENSITIVE Sensitive     GENTAMICIN <=1 SENSITIVE Sensitive     IMIPENEM <=0.25 SENSITIVE Sensitive     NITROFURANTOIN <=16 SENSITIVE Sensitive     TRIMETH/SULFA <=20 SENSITIVE Sensitive     AMPICILLIN/SULBACTAM <=2 SENSITIVE Sensitive     PIP/TAZO <=4 SENSITIVE Sensitive     * 50,000 COLONIES/mL ESCHERICHIA COLI  MRSA PCR Screening     Status: None   Collection Time: 09/30/20 10:00 AM   Specimen: Nasopharyngeal  Result Value Ref Range Status   MRSA by PCR NEGATIVE NEGATIVE Final    Comment:        The GeneXpert MRSA Assay (FDA approved for NASAL specimens only), is one component of a comprehensive MRSA colonization surveillance program. It is not intended to diagnose MRSA infection nor to guide or monitor treatment for MRSA infections. Performed at [facility], [address], [place], [place] [address]   SARS CORONAVIRUS 2 (TAT 6-24 HRS) Nasopharyngeal Nasopharyngeal Swab     Status: None   Collection Time: 10/03/20  4:13 PM   Specimen: Nasopharyngeal Swab  Result Value Ref Range  Status   SARS Coronavirus 2 NEGATIVE NEGATIVE Final    Comment: (NOTE) SARS-CoV-2 target nucleic acids are NOT DETECTED.  The SARS-CoV-2 RNA is generally detectable in upper and lower respiratory specimens during the acute phase of infection. Negative results do not preclude SARS-CoV-2 infection, do not rule out co-infections with other pathogens, and should not be used as the sole basis for treatment or other patient management decisions. Negative results must be combined with clinical observations, patient history, and epidemiological information. The expected result is Negative.  Fact Sheet for Patients: HairSlick.no  Fact Sheet for Healthcare Providers: quierodirigir.com  This test is not yet approved or cleared by the [place] FDA and  has been  authorized for detection and/or diagnosis of SARS-CoV-2 by FDA under an Emergency Use Authorization (EUA). This EUA will remain  in effect (meaning this test can be used) for the duration of the COVID-19 declaration under Se ction 564(b)(1) of the Act, 21 U.S.C. section 360bbb-3(b)(1), unless the authorization is terminated or revoked sooner.  Performed at [facility], [address], [place], [place] [address]      Time coordinating discharge:  40 minutes  SIGNED:   Dorcas Carrow, MD  [facility] 10/04/2020, 12:28 PM

## 2020-10-04 NOTE — [place] FL2 (Signed)
[place] MEDICAID FL2 LEVEL OF CARE SCREENING TOOL     IDENTIFICATION  Patient Name: Brianna Lowe Birthdate: December 03, 1934 Sex: female Admission Date (Current Location): 09/29/2020  [facility] and [place] Number:  Producer, television/film/video and Address:  [facility], [address] [place], [place] [address]      Provider Number: [id number]  Attending Physician Name and Address:  Dorcas Carrow, MD  Relative Name and Phone Number:  Lynnell Catalan Daughter   [phone]    Current Level of Care: Hospital Recommended Level of Care: Skilled Nursing Facility Prior Approval Number:    Date Approved/Denied:   PASRR Number: [id number]  Discharge Plan: SNF    Current Diagnoses: Patient Active Problem List   Diagnosis Date Noted  . Severe sepsis (HCC) 09/30/2020  . Acute respiratory failure with hypoxia and hypercapnia (HCC) 09/30/2020  . Acute febrile illness 09/29/2020  . SIRS (systemic inflammatory response syndrome) (HCC) 09/29/2020  . ARF (acute renal failure) (HCC) 09/29/2020  . PAF (paroxysmal atrial fibrillation) (HCC) 09/29/2020    Orientation RESPIRATION BLADDER Height & Weight     Self,Time,Situation,Place  O2 Continent Weight: 229 lb 8 oz (104.1 kg) Height:  5\' 4"  (162.6 cm)  BEHAVIORAL SYMPTOMS/MOOD NEUROLOGICAL BOWEL NUTRITION STATUS      Continent Diet (Regular diet.  See discharge summary.)  AMBULATORY STATUS COMMUNICATION OF NEEDS Skin   Limited Assist Verbally Normal                       Personal Care Assistance Level of Assistance  Bathing,Feeding,Dressing Bathing Assistance: Limited assistance Feeding assistance: Independent Dressing Assistance: Limited assistance     Functional Limitations Info  Sight,Hearing,Speech Sight Info: Adequate Hearing Info: Adequate Speech Info: Adequate    SPECIAL CARE FACTORS FREQUENCY  PT (By licensed PT),OT (By licensed OT)     PT Frequency: 5x week OT Frequency: 5x week             Contractures Contractures Info: Not present    Additional Factors Info  Code Status,Allergies,Insulin Sliding Scale Code Status Info: full Allergies Info: Doxycycline, Aspirin, Codeine, Tramadol, Ace Inhibitors, Hydrocodone, Hydrocodone-acetaminophen, Oxycodone-acetaminophen, Propoxyphene, Statins, Sulfa Antibiotics   Insulin Sliding Scale Info: see discharge summary       Current Medications (10/04/2020):  This is the current hospital active medication list Current Facility-Administered Medications  Medication Dose Route Frequency Provider Last Rate Last Admin  . 0.9 %  sodium chloride infusion  250 mL Intravenous Continuous 10/06/2020 B, NP      . acetaminophen (TYLENOL) tablet 650 mg  650 mg Oral Q6H PRN Selmer Dominion, MD       Or  . acetaminophen (TYLENOL) suppository 650 mg  650 mg Rectal Q6H PRN Eduard Clos, MD      . amoxicillin (AMOXIL) capsule 500 mg  500 mg Oral Q8H Eduard Clos, Daiva Eves, MD   500 mg at 10/04/20 [phone]  . apixaban (ELIQUIS) tablet 5 mg  5 mg Oral BID [id number], MD   5 mg at 10/04/20 0801  . Chlorhexidine Gluconate Cloth 2 % PADS 6 each  6 each Topical Daily 10/06/20, MD   6 each at 10/04/20 [phone]  . ezetimibe (ZETIA) tablet 10 mg  10 mg Oral Daily [id number], MD   10 mg at 10/04/20 0801  . insulin aspart (novoLOG) injection 0-5 Units  0-5 Units Subcutaneous QHS 10/06/20, MD      . insulin aspart (  novoLOG) injection 0-9 Units  0-9 Units Subcutaneous TID WC John Giovanni, MD   3 Units at 10/04/20 0802  . insulin detemir (LEVEMIR) injection 10 Units  10 Units Subcutaneous BID Selmer Dominion B, NP   10 Units at 10/04/20 0802  . ipratropium-albuterol (DUONEB) 0.5-2.5 (3) MG/3ML nebulizer solution 3 mL  3 mL Nebulization Q6H PRN Selmer Dominion B, NP      . lip balm (CARMEX) ointment   Topical PRN Cheri Fowler, MD      . melatonin tablet 5 mg  5 mg Oral QHS PRN Dorcas Carrow, MD      . metoprolol  tartrate (LOPRESSOR) tablet 75 mg  75 mg Oral BID Pricilla Riffle, MD   75 mg at 10/04/20 0802  . pantoprazole (PROTONIX) EC tablet 40 mg  40 mg Oral Daily Calton Dach I, RPH   40 mg at 10/04/20 9563     Discharge Medications: Please see discharge summary for a list of discharge medications.  Relevant Imaging Results:  Relevant Lab Results:   Additional Information SSN [id number].  Pt is vaccinated and boosted for covid.  Lorri Frederick, LCSW

## 2020-10-04 NOTE — Progress Notes (Signed)
Physical Therapy Treatment Patient Details Name: Brianna Lowe MRN: [id number] DOB: February 12, 1935 Today's Date: 10/04/2020    History of Present Illness Pt is an 86 yo female s/p nausea, vomiting, and diarrhead, UTI, PNA and SOB requiring O2. PMHx: Afib, T2DM, H/O CAD, L femur fx, Dementia.    PT Comments    Patient fatigued after bath and getting OOB to chair with nursing. Agreed to PT, however unable to tolerated ambulation >6 feet due to fatigue. Participated in LE exercises. Discussed discharge plan and pt now agreeable to SNF at [facility] (where she resides in [facility] normally).     Follow Up Recommendations  SNF (at [facility])     Equipment Recommendations  None recommended by PT    Recommendations for Other Services       Precautions / Restrictions Precautions Precautions: Fall;Other (comment) Precaution Comments: watch O2 Restrictions Weight Bearing Restrictions: No    Mobility  Bed Mobility               General bed mobility comments: up in chair finishing bath on arrival    Transfers Overall transfer level: Needs assistance Equipment used: Rolling walker (2 wheeled) Transfers: Sit to/from Stand Sit to Stand: Min assist         General transfer comment: MinA for stability and boost to stand  Ambulation/Gait Ambulation/Gait assistance: Min assist Gait Distance (Feet): 6 Feet (3 ft fwd and 3 ft backward) Assistive device: Rolling walker (2 wheeled) Gait Pattern/deviations: Shuffle Gait velocity: decr   General Gait Details: reported she was too fatigued to do any more   Stairs             Wheelchair Mobility    Modified Rankin (Stroke Patients Only)       Balance Overall balance assessment: Needs assistance Sitting-balance support: Bilateral upper extremity supported Sitting balance-Leahy Scale: Fair Sitting balance - Comments: fair balance   Standing balance support: Bilateral upper extremity supported Standing  balance-Leahy Scale: Poor Standing balance comment: reliant on RW                            Cognition Arousal/Alertness: Awake/alert Behavior During Therapy: WFL for tasks assessed/performed Overall Cognitive Status: No family/caregiver present to determine baseline cognitive functioning                                 General Comments: orientd to person, place, month but not why she is in the hospital      Exercises General Exercises - Lower Extremity Ankle Circles/Pumps: AROM;Both;5 reps Quad Sets: AROM;Both;10 reps Long Arc Quad: AROM;10 reps;Both Heel Raises: AROM;Both;10 reps    General Comments General comments (skin integrity, edema, etc.): maintianed on 2L as pt just tested on room air by nursing with sats down to 86%      Pertinent Vitals/Pain Pain Assessment: Faces Faces Pain Scale: Hurts a little bit Pain Location: left flank Pain Descriptors / Indicators: Discomfort;Sore Pain Intervention(s): Limited activity within patient's tolerance    Home Living                      Prior Function            PT Goals (current goals can now be found in the care plan section) Acute Rehab PT Goals Patient Stated Goal: to go home Time For Goal Achievement: 10/17/20 Potential to Achieve Goals:  Good Progress towards PT goals: Progressing toward goals    Frequency    Min 2X/week      PT Plan Discharge plan needs to be updated    Co-evaluation              AM-PAC PT "6 Clicks" Mobility   Outcome Measure  Help needed turning from your back to your side while in a flat bed without using bedrails?: A Lot Help needed moving from lying on your back to sitting on the side of a flat bed without using bedrails?: A Lot Help needed moving to and from a bed to a chair (including a wheelchair)?: A Little Help needed standing up from a chair using your arms (e.g., wheelchair or bedside chair)?: A Little Help needed to walk in hospital  room?: A Little Help needed climbing 3-5 steps with a railing? : Total 6 Click Score: 14    End of Session Equipment Utilized During Treatment: Gait belt;Oxygen Activity Tolerance: Patient limited by fatigue Patient left: in chair;with call bell/phone within reach;with chair alarm set Nurse Communication: Mobility status PT Visit Diagnosis: Muscle weakness (generalized) (M62.81);Difficulty in walking, not elsewhere classified (R26.2)     Time: 4462-8638 PT Time Calculation (min) (ACUTE ONLY): 16 min  Charges:  $Therapeutic Exercise: 8-22 mins                      Jerolyn Center, PT Pager [phone]    Zena Amos 10/04/2020, 10:38 AM

## 2020-10-04 NOTE — Progress Notes (Signed)
O2 sats spot checked on RA due to patient removing Oxygen. Sats noted to be 84-86% replaced O2 at 3L Sun will re-evaluate. Patient is resting in bed with no distress noted. Bed is in lowest position will call light within reach. Plans to D/C to SNF when bed is available.

## 2020-10-04 NOTE — Progress Notes (Signed)
PROGRESS NOTE    Brianna Lowe  TXH:[id number] DOB: [birth year] DOA: 09/29/2020 PCP: Pcp, No    Brief Narrative:  86 year old female who lives in an independent living facility at [facility], she has history of TAVR, paroxysmal A. fib, type 2 diabetes, mitral stenosis, history of coronary artery disease, recent left femur fracture who presented to the emergency room with progressive weakness for about 24 hours, nausea vomiting and episode of diarrhea.  She was recently treated with antibiotics for rash on her chest.  In the emergency room, she was febrile with temperature 103.  Initially on 2 L oxygen, chest x-ray showing right lower lobe pneumonia.  Lactic acid was 2.  WBC count was 17.  UA was abnormal.  Patient was started on antibiotics with sepsis protocol, she became more short of breath needing heated high flow oxygen and was admitted to ICU. 4/14, admission to medical floor for sepsis and fever, UTI and possible aspiration pneumonia, developed progressive respiratory distress on BiPAP, started on vancomycin and ceftriaxone azithromycin and admitted to ICU.  Also transiently needed on vasopressor. 4/16, transferred to medical floor on 4 L of oxygen. 4/19, gradually improving.  Assessment & Plan:   Principal Problem:   Severe sepsis (HCC) Active Problems:   Acute febrile illness   SIRS (systemic inflammatory response syndrome) (HCC)   ARF (acute renal failure) (HCC)   PAF (paroxysmal atrial fibrillation) (HCC)   Acute respiratory failure with hypoxia and hypercapnia (HCC)  Sepsis present on admission secondary to E. coli bacteremia/aspiration pneumonia: Acute hypoxemic respiratory failure. Urine culture, 50,000 colonies of E. coli. Blood cultures, pansensitive E. Coli. Chest x-ray with right lower lobe pneumonia Initially treated with broad-spectrum antibiotics, and subsequently with ceftriaxone. ID recommended total 1 week of treatment, 2 more days of amoxicillin.   Clinically improved. Seen by speech, on regular diet.  All-time aspiration precautions.  Keep on oxygen to keep saturations more than 90%.  Breathing treatments, chest physiotherapy, bronchodilator therapy, mobility today. CT scan of the abdomen pelvis 4/14 with no evidence of intra-abdominal pathology, no urinary retention.  Acute kidney injury: Resolved.  On intermittent dialysis.  Paroxysmal atrial fibrillation with RVR: All rate control medications were discontinued due to low blood pressure. Blood pressures improved now.  Metoprolol increased to 75 mg twice a day.  On Toprol-XL 100 mg at home. Heart rate still suboptimally controlled, resume diltiazem 120 mg that she takes at home. She is therapeutic on Eliquis.  Demand ischemia in a patient with history of coronary artery disease, aortic stenosis status post TAVR: Troponin peaked to 720.  Followed by cardiology.  Recommended conservative management.  Currently remains on beta-blockers, statin, therapeutic on Eliquis.  Patient is on aspirin at home, will discontinue with concomitant use of Eliquis.  Essential hypertension: Blood pressures are adequate today.    Type 2 diabetes: Uncontrolled.  Patient on glipizide and insulin at home.  Currently remains on reduced dose of insulin and blood sugars are stable.  Physical debility: Start working with PT OT today.  Anticipate discharge to skilled nursing facility.  Patient medically stabilizing.  Discharge to skilled nursing facility when bed available.   DVT prophylaxis:  apixaban (ELIQUIS) tablet 5 mg   Code Status: Full code Family Communication: None today. Disposition Plan: Status is: Inpatient  Remains inpatient appropriate because:IV treatments appropriate due to intensity of illness or inability to take PO and Inpatient level of care appropriate due to severity of illness   Dispo: The patient is from: Home  Anticipated d/c is to: SNF              Patient  currently is medically stable.   Difficult to place patient No         Consultants:   Cardiology  PCCM  Procedures:   None  Antimicrobials:  Antibiotics Given (last 72 hours)    Date/Time Action Medication Dose Rate   10/01/20 2029 New Bag/Given   cefTRIAXone (ROCEPHIN) 2 g in sodium chloride 0.9 % 100 mL IVPB 2 g 200 mL/hr   10/02/20 2127 New Bag/Given   cefTRIAXone (ROCEPHIN) 2 g in sodium chloride 0.9 % 100 mL IVPB 2 g 200 mL/hr   10/03/20 1423 New Bag/Given   ceFAZolin (ANCEF) IVPB 2g/100 mL premix 2 g 200 mL/hr   10/03/20 2234 New Bag/Given   ceFAZolin (ANCEF) IVPB 2g/100 mL premix 2 g 200 mL/hr   10/04/20 0519 New Bag/Given   ceFAZolin (ANCEF) IVPB 2g/100 mL premix 2 g 200 mL/hr   10/04/20 [id number] Given   amoxicillin (AMOXIL) capsule 500 mg 500 mg          Subjective: Patient seen and examined.  No overnight events.  She got good rest last night and today she is cheerful. He cannot wait to go back to [facility].  Objective: Vitals:   10/03/20 1000 10/03/20 1554 10/03/20 1943 10/04/20 0455  BP:  (!) 150/80 (!) 147/68 138/78  Pulse: 98 96 87 90  Resp:  16 19 20   Temp:  98.6 F (37 C) 97.6 F (36.4 C) 97.6 F (36.4 C)  TempSrc:    Axillary  SpO2: 97% 100% 96% 98%  Weight:      Height:        Intake/Output Summary (Last 24 hours) at 10/04/2020 1043 Last data filed at 10/04/2020 0715 Gross per 24 hour  Intake 200 ml  Output 700 ml  Net -500 ml   Filed Weights   09/29/20 1813 10/01/20 0439 10/02/20 0405  Weight: 68 kg 68 kg 104.1 kg    Examination:  General: Comfortable.  On 2 to 3 L of oxygen, however fairly comfortable at rest. Cardiovascular: S1-S2 normal.  Irregularly irregular. Respiratory: Bilateral clear Gastrointestinal: Soft and nontender.  Bowel sounds present Ext: No swelling or edema.  No cyanosis. Neuro: No joint effusion or deformity. Alert oriented x4.  No neurological deficits.    Data Reviewed: I have personally  reviewed following labs and imaging studies  CBC: Recent Labs  Lab 09/29/20 1820 09/30/20 0501 09/30/20 0712 09/30/20 0848 10/01/20 0100 10/02/20 0509 10/03/20 0346  WBC 17.3* 18.6*  --   --  19.9* 17.1* 11.0*  NEUTROABS 15.7*  --   --   --   --   --   --   HGB 13.9 13.6 14.3 12.9 11.5* 11.8* 13.0  HCT 42.9 42.2 42.0 38.0 35.9* 36.5 40.9  MCV 95.8 98.4  --   --  96.8 97.9 97.6  PLT 200 201  --   --  163 152 164   Basic Metabolic Panel: Recent Labs  Lab 09/29/20 1820 09/30/20 0501 09/30/20 0712 09/30/20 0848 09/30/20 1051 10/01/20 0100 10/02/20 0509 10/03/20 0346  NA 132* 136 135 136  --  138 137 142  K 5.0 4.3 4.1 4.1  --  4.3 4.3 4.1  CL 96* 98  --   --   --  105 102 102  CO2 23 25  --   --   --  25 28 34*  GLUCOSE  214* 206*  --   --   --  198* 207* 123*  BUN 25* 27*  --   --   --  37* 32* 22  CREATININE 1.24* 1.69*  --   --   --  1.76* 1.09* 0.84  CALCIUM 8.3* 8.6*  --   --   --  8.1* 8.6* 9.0  MG  --   --   --   --  1.7  --   --  2.0  PHOS  --   --   --   --   --   --   --  3.1   GFR: Estimated Creatinine Clearance: 59.7 mL/min (by C-G formula based on SCr of 0.84 mg/dL). Liver Function Tests: Recent Labs  Lab 09/29/20 1820 09/30/20 0501  AST 25 19  ALT 15 20  ALKPHOS 99 94  BILITOT 3.4* 1.6*  PROT 5.9* 6.1*  ALBUMIN 2.9* 2.9*   Recent Labs  Lab 09/29/20 1820  LIPASE 26   No results for input(s): AMMONIA in the last 168 hours. Coagulation Profile: Recent Labs  Lab 09/30/20 1051  INR 1.8*   Cardiac Enzymes: No results for input(s): CKTOTAL, CKMB, CKMBINDEX, TROPONINI in the last 168 hours. BNP (last 3 results) No results for input(s): PROBNP in the last 8760 hours. HbA1C: No results for input(s): HGBA1C in the last 72 hours. CBG: Recent Labs  Lab 10/03/20 1217 10/03/20 1607 10/03/20 2033 10/04/20 0102 10/04/20 0732  GLUCAP 133* 154* 143* 245* 229*   Lipid Profile: No results for input(s): CHOL, HDL, LDLCALC, TRIG, CHOLHDL,  LDLDIRECT in the last 72 hours. Thyroid Function Tests: No results for input(s): TSH, T4TOTAL, FREET4, T3FREE, THYROIDAB in the last 72 hours. Anemia Panel: No results for input(s): VITAMINB12, FOLATE, FERRITIN, TIBC, IRON, RETICCTPCT in the last 72 hours. Sepsis Labs: Recent Labs  Lab 09/29/20 2023 09/30/20 0452  LATICACIDVEN 2.0* 1.8    Recent Results (from the past 240 hour(s))  Resp Panel by RT-PCR (Flu A&B, Covid) Nasopharyngeal Swab     Status: None   Collection Time: 09/29/20  7:22 PM   Specimen: Nasopharyngeal Swab; Nasopharyngeal(NP) swabs in vial transport medium  Result Value Ref Range Status   SARS Coronavirus 2 by RT PCR NEGATIVE NEGATIVE Final    Comment: (NOTE) SARS-CoV-2 target nucleic acids are NOT DETECTED.  The SARS-CoV-2 RNA is generally detectable in upper respiratory specimens during the acute phase of infection. The lowest concentration of SARS-CoV-2 viral copies this assay can detect is 138 copies/mL. A negative result does not preclude SARS-Cov-2 infection and should not be used as the sole basis for treatment or other patient management decisions. A negative result may occur with  improper specimen collection/handling, submission of specimen other than nasopharyngeal swab, presence of viral mutation(s) within the areas targeted by this assay, and inadequate number of viral copies(<138 copies/mL). A negative result must be combined with clinical observations, patient history, and epidemiological information. The expected result is Negative.  Fact Sheet for Patients:  BloggerCourse.com  Fact Sheet for Healthcare Providers:  SeriousBroker.it  This test is no t yet approved or cleared by the [place] FDA and  has been authorized for detection and/or diagnosis of SARS-CoV-2 by FDA under an Emergency Use Authorization (EUA). This EUA will remain  in effect (meaning this test can be used) for the  duration of the COVID-19 declaration under Section 564(b)(1) of the Act, 21 U.S.C.section 360bbb-3(b)(1), unless the authorization is terminated  or revoked sooner.  Influenza A by PCR NEGATIVE NEGATIVE Final   Influenza B by PCR NEGATIVE NEGATIVE Final    Comment: (NOTE) The Xpert Xpress SARS-CoV-2/FLU/RSV plus assay is intended as an aid in the diagnosis of influenza from Nasopharyngeal swab specimens and should not be used as a sole basis for treatment. Nasal washings and aspirates are unacceptable for Xpert Xpress SARS-CoV-2/FLU/RSV testing.  Fact Sheet for Patients: BloggerCourse.com  Fact Sheet for Healthcare Providers: SeriousBroker.it  This test is not yet approved or cleared by the [place] FDA and has been authorized for detection and/or diagnosis of SARS-CoV-2 by FDA under an Emergency Use Authorization (EUA). This EUA will remain in effect (meaning this test can be used) for the duration of the COVID-19 declaration under Section 564(b)(1) of the Act, 21 U.S.C. section 360bbb-3(b)(1), unless the authorization is terminated or revoked.  Performed at [facility], [address] [place], [place] [address]   Culture, blood (routine x 2)     Status: Abnormal   Collection Time: 09/29/20  9:10 PM   Specimen: BLOOD  Result Value Ref Range Status   Specimen Description BLOOD RIGHT ANTECUBITAL  Final   Special Requests   Final    BOTTLES DRAWN AEROBIC AND ANAEROBIC Blood Culture adequate volume   Culture  Setup Time   Final    GRAM NEGATIVE RODS IN BOTH AEROBIC AND ANAEROBIC BOTTLES PHARMD E.WOLFE AT 1256 ON 09/30/2020 BY T.SAAD Performed at [facility], [address], [place], [place] [address]    Culture ESCHERICHIA COLI (A)  Final   Report Status 10/02/2020 FINAL  Final   Organism ID, Bacteria ESCHERICHIA COLI  Final      Susceptibility   Escherichia coli - MIC*    AMPICILLIN <=2  SENSITIVE Sensitive     CEFAZOLIN <=4 SENSITIVE Sensitive     CEFEPIME <=0.12 SENSITIVE Sensitive     CEFTAZIDIME <=1 SENSITIVE Sensitive     CEFTRIAXONE <=0.25 SENSITIVE Sensitive     CIPROFLOXACIN 0.5 SENSITIVE Sensitive     GENTAMICIN <=1 SENSITIVE Sensitive     IMIPENEM <=0.25 SENSITIVE Sensitive     TRIMETH/SULFA <=20 SENSITIVE Sensitive     AMPICILLIN/SULBACTAM <=2 SENSITIVE Sensitive     PIP/TAZO <=4 SENSITIVE Sensitive     * ESCHERICHIA COLI  Blood Culture ID Panel (Reflexed)     Status: Abnormal   Collection Time: 09/29/20  9:10 PM  Result Value Ref Range Status   Enterococcus faecalis NOT DETECTED NOT DETECTED Final   Enterococcus Faecium NOT DETECTED NOT DETECTED Final   Listeria monocytogenes NOT DETECTED NOT DETECTED Final   Staphylococcus species NOT DETECTED NOT DETECTED Final   Staphylococcus aureus (BCID) NOT DETECTED NOT DETECTED Final   Staphylococcus epidermidis NOT DETECTED NOT DETECTED Final   Staphylococcus lugdunensis NOT DETECTED NOT DETECTED Final   Streptococcus species NOT DETECTED NOT DETECTED Final   Streptococcus agalactiae NOT DETECTED NOT DETECTED Final   Streptococcus pneumoniae NOT DETECTED NOT DETECTED Final   Streptococcus pyogenes NOT DETECTED NOT DETECTED Final   A.calcoaceticus-baumannii NOT DETECTED NOT DETECTED Final   Bacteroides fragilis NOT DETECTED NOT DETECTED Final   Enterobacterales DETECTED (A) NOT DETECTED Final    Comment: Enterobacterales represent a large order of gram negative bacteria, not a single organism. CRITICAL RESULT CALLED TO, READ BACK BY AND VERIFIED WITH: PHARMD E.WOLFE AT 1256 ON 09/30/2020 BY T.SAAD.    Enterobacter cloacae complex NOT DETECTED NOT DETECTED Final   Escherichia coli DETECTED (A) NOT DETECTED Final  Comment: CRITICAL RESULT CALLED TO, READ BACK BY AND VERIFIED WITH: PHARMD E.WOLFE AT 1256 ON 09/30/2020 BY T.SAAD.    Klebsiella aerogenes NOT DETECTED NOT DETECTED Final   Klebsiella oxytoca NOT  DETECTED NOT DETECTED Final   Klebsiella pneumoniae NOT DETECTED NOT DETECTED Final   Proteus species NOT DETECTED NOT DETECTED Final   Salmonella species NOT DETECTED NOT DETECTED Final   Serratia marcescens NOT DETECTED NOT DETECTED Final   Haemophilus influenzae NOT DETECTED NOT DETECTED Final   Neisseria meningitidis NOT DETECTED NOT DETECTED Final   Pseudomonas aeruginosa NOT DETECTED NOT DETECTED Final   Stenotrophomonas maltophilia NOT DETECTED NOT DETECTED Final   Candida albicans NOT DETECTED NOT DETECTED Final   Candida auris NOT DETECTED NOT DETECTED Final   Candida glabrata NOT DETECTED NOT DETECTED Final   Candida krusei NOT DETECTED NOT DETECTED Final   Candida parapsilosis NOT DETECTED NOT DETECTED Final   Candida tropicalis NOT DETECTED NOT DETECTED Final   Cryptococcus neoformans/gattii NOT DETECTED NOT DETECTED Final   CTX-M ESBL NOT DETECTED NOT DETECTED Final   Carbapenem resistance IMP NOT DETECTED NOT DETECTED Final   Carbapenem resistance KPC NOT DETECTED NOT DETECTED Final   Carbapenem resistance NDM NOT DETECTED NOT DETECTED Final   Carbapenem resist OXA 48 LIKE NOT DETECTED NOT DETECTED Final   Carbapenem resistance VIM NOT DETECTED NOT DETECTED Final    Comment: Performed at [facility], [address] [place], [place] [address]  Culture, blood (routine x 2)     Status: None   Collection Time: 09/29/20  9:17 PM   Specimen: BLOOD LEFT HAND  Result Value Ref Range Status   Specimen Description BLOOD LEFT HAND  Final   Special Requests   Final    BOTTLES DRAWN AEROBIC AND ANAEROBIC Blood Culture results may not be optimal due to an inadequate volume of blood received in culture bottles   Culture   Final    NO GROWTH 5 DAYS Performed at [facility], [address], [place], [place] [address]    Report Status 10/04/2020 FINAL  Final  Culture, Urine     Status: Abnormal   Collection Time: 09/29/20  9:56 PM   Specimen: Urine, Random  Result  Value Ref Range Status   Specimen Description URINE, RANDOM  Final   Special Requests   Final    NONE Performed at [facility], [address] [place], [place] [address]    Culture 50,000 COLONIES/mL ESCHERICHIA COLI (A)  Final   Report Status 10/03/2020 FINAL  Final   Organism ID, Bacteria ESCHERICHIA COLI (A)  Final      Susceptibility   Escherichia coli - MIC*    AMPICILLIN <=2 SENSITIVE Sensitive     CEFAZOLIN <=4 SENSITIVE Sensitive     CEFEPIME <=0.12 SENSITIVE Sensitive     CEFTRIAXONE <=0.25 SENSITIVE Sensitive     CIPROFLOXACIN 0.5 SENSITIVE Sensitive     GENTAMICIN <=1 SENSITIVE Sensitive     IMIPENEM <=0.25 SENSITIVE Sensitive     NITROFURANTOIN <=16 SENSITIVE Sensitive     TRIMETH/SULFA <=20 SENSITIVE Sensitive     AMPICILLIN/SULBACTAM <=2 SENSITIVE Sensitive     PIP/TAZO <=4 SENSITIVE Sensitive     * 50,000 COLONIES/mL ESCHERICHIA COLI  MRSA PCR Screening     Status: None   Collection Time: 09/30/20 10:00 AM   Specimen: Nasopharyngeal  Result Value Ref Range Status   MRSA by PCR NEGATIVE NEGATIVE Final    Comment:  The GeneXpert MRSA Assay (FDA approved for NASAL specimens only), is one component of a comprehensive MRSA colonization surveillance program. It is not intended to diagnose MRSA infection nor to guide or monitor treatment for MRSA infections. Performed at [facility], [address] [place], [place] [address]   SARS CORONAVIRUS 2 (TAT 6-24 HRS) Nasopharyngeal Nasopharyngeal Swab     Status: None   Collection Time: 10/03/20  4:13 PM   Specimen: Nasopharyngeal Swab  Result Value Ref Range Status   SARS Coronavirus 2 NEGATIVE NEGATIVE Final    Comment: (NOTE) SARS-CoV-2 target nucleic acids are NOT DETECTED.  The SARS-CoV-2 RNA is generally detectable in upper and lower respiratory specimens during the acute phase of infection. Negative results do not preclude SARS-CoV-2 infection, do not rule out co-infections with other  pathogens, and should not be used as the sole basis for treatment or other patient management decisions. Negative results must be combined with clinical observations, patient history, and epidemiological information. The expected result is Negative.  Fact Sheet for Patients: HairSlick.no  Fact Sheet for Healthcare Providers: quierodirigir.com  This test is not yet approved or cleared by the [place] FDA and  has been authorized for detection and/or diagnosis of SARS-CoV-2 by FDA under an Emergency Use Authorization (EUA). This EUA will remain  in effect (meaning this test can be used) for the duration of the COVID-19 declaration under Se ction 564(b)(1) of the Act, 21 U.S.C. section 360bbb-3(b)(1), unless the authorization is terminated or revoked sooner.  Performed at [facility], [address] [place], [place] [address]          Radiology Studies: No results found.      Scheduled Meds: . amoxicillin  500 mg Oral Q8H  . apixaban  5 mg Oral BID  . Chlorhexidine Gluconate Cloth  6 each Topical Daily  . diltiazem  120 mg Oral Daily  . ezetimibe  10 mg Oral Daily  . insulin aspart  0-5 Units Subcutaneous QHS  . insulin aspart  0-9 Units Subcutaneous TID WC  . insulin detemir  10 Units Subcutaneous BID  . metoprolol tartrate  75 mg Oral BID  . pantoprazole  40 mg Oral Daily   Continuous Infusions: . sodium chloride       LOS: 4 days    Time spent: 32 minutes    Dorcas Carrow, MD [facility] Pager [phone]

## 2020-10-24 ENCOUNTER — Ambulatory Visit: Payer: Medicare Other | Admitting: Infectious Disease

## 2021-12-05 IMAGING — DX DG CHEST 1V PORT
1 series · 1 of 1 positions shown · non-contrast
Comparison: 09/29/2020

CLINICAL DATA: Weakness, nausea, vomiting

EXAM:
PORTABLE CHEST 1 VIEW

[chest ap]
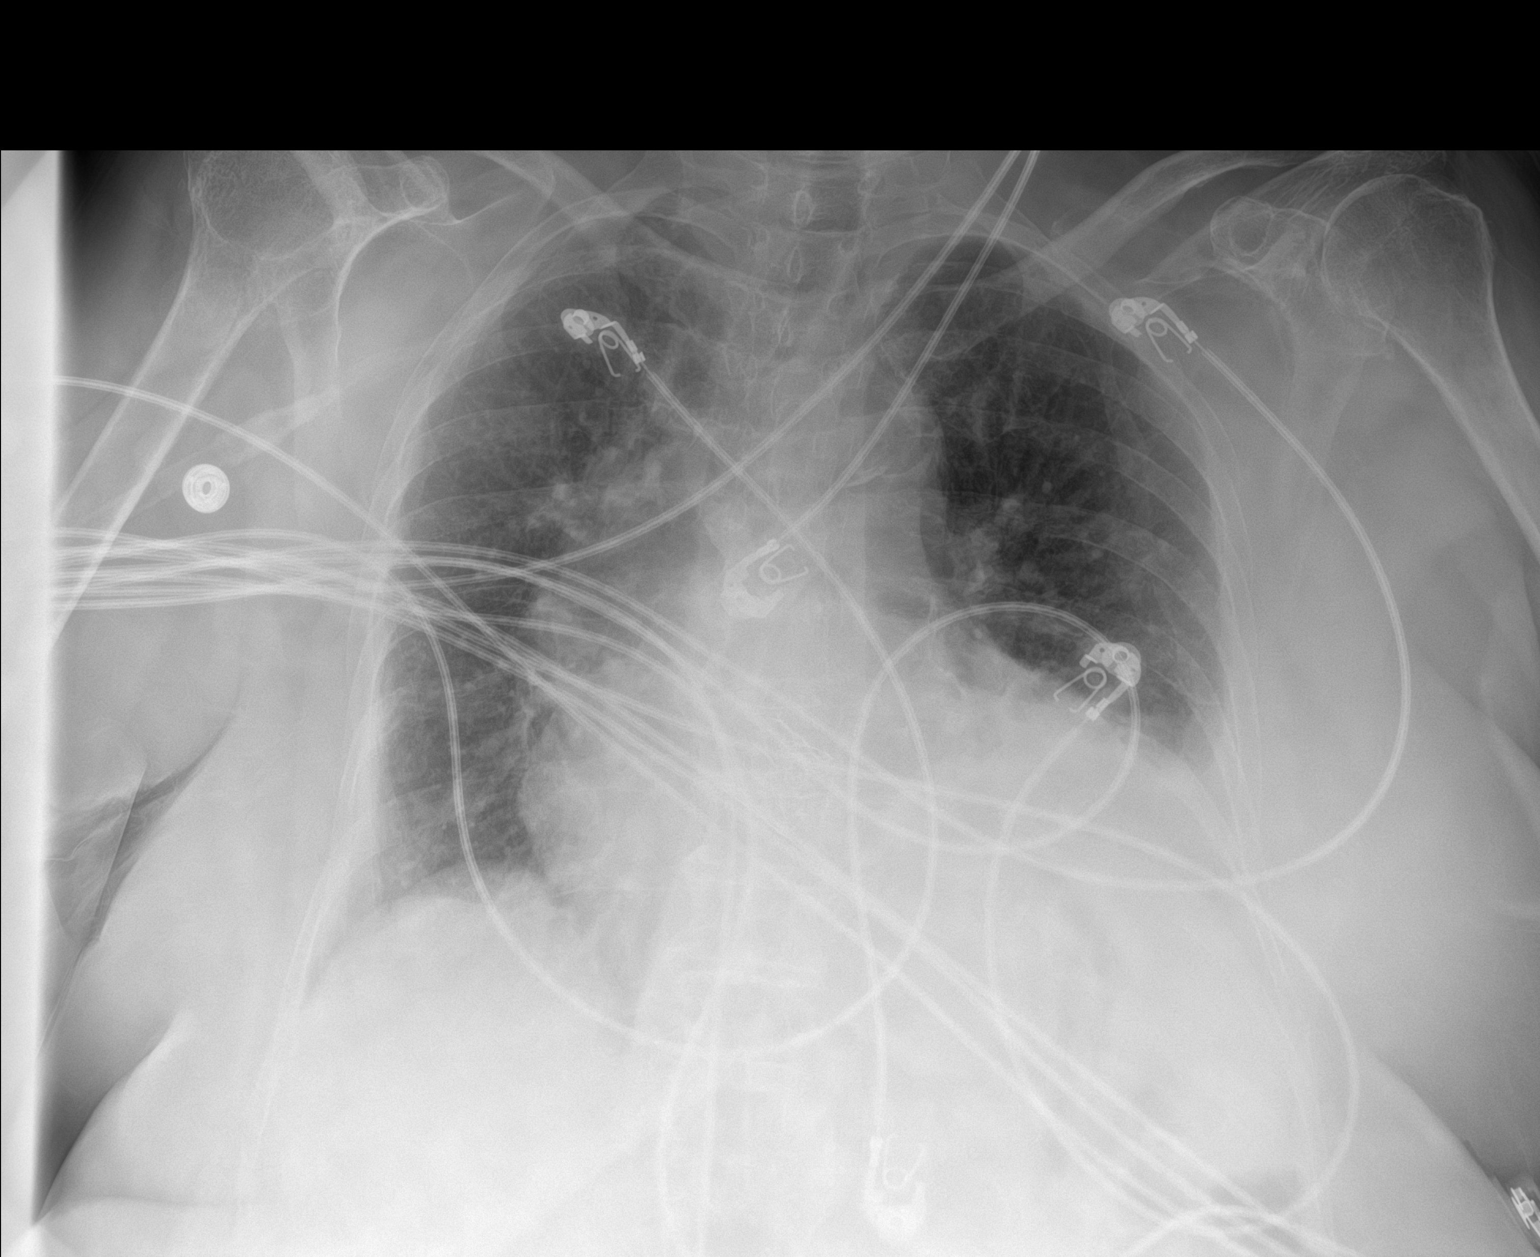

[1 of 1 positions shown; findings below may reference images not displayed]

FINDINGS: Small left pleural effusion is present with associated left basilar
atelectasis. Lungs are otherwise clear. No pneumothorax. No pleural
effusion on the right. Mild to moderate cardiomegaly is unchanged.
Transcatheter aortic valve replacement has been performed. The
pulmonary vascularity is normal.
IMPRESSION: No active disease.

Stable cardiomegaly and small left pleural effusion.

## 2021-12-05 IMAGING — CT CT RENAL STONE PROTOCOL
2 of 4 series · 17 of 46 positions shown, 19 images · non-contrast
Comparison: None.

CLINICAL DATA: Weakness, nausea, vomiting, diarrhea

EXAM:
CT ABDOMEN AND PELVIS WITHOUT CONTRAST
TECHNIQUE: Multidetector CT imaging of the abdomen and pelvis was performed
following the standard protocol without IV contrast.

[Series 3: stone study 5.0 i30f 2 · axial · 0.78mm/px · z∈[+899,+1229]mm · 14 of 75 slices shown, 16 images]
[im 6/75  soft-tissue]
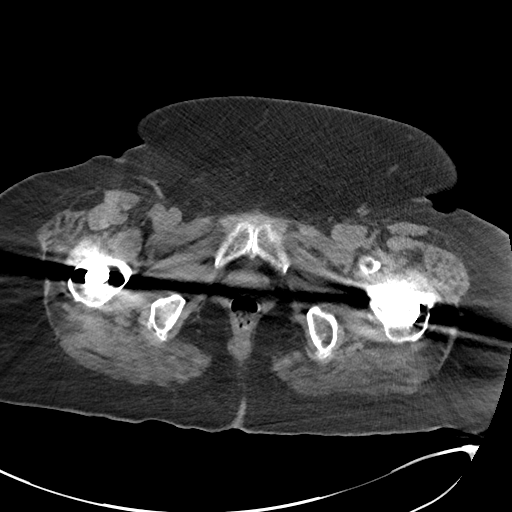
[im 6/75  bone]
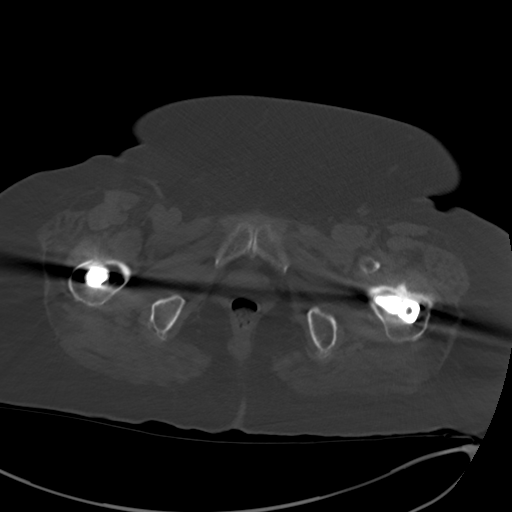
[im 11/75  soft-tissue]
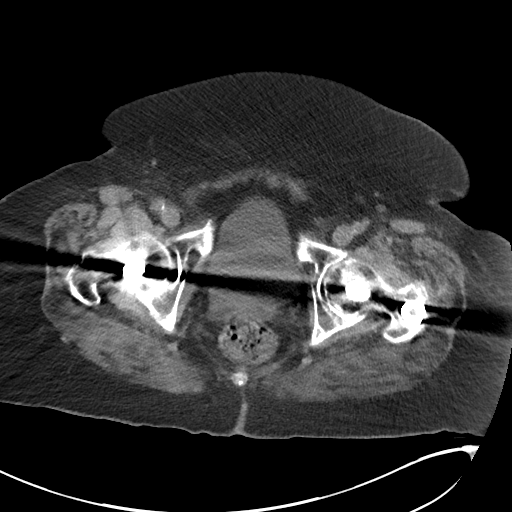
[im 16/75  soft-tissue]
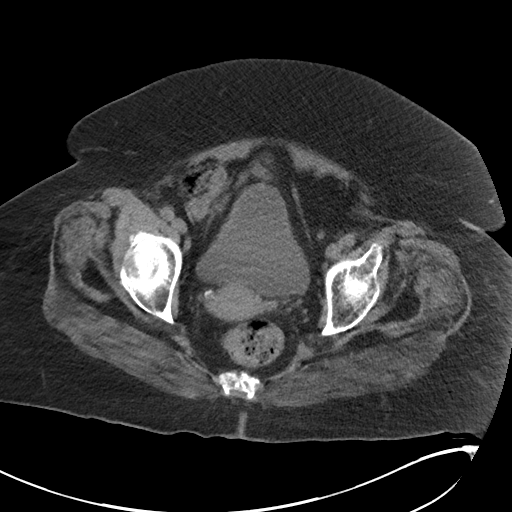
[im 21/75  soft-tissue]
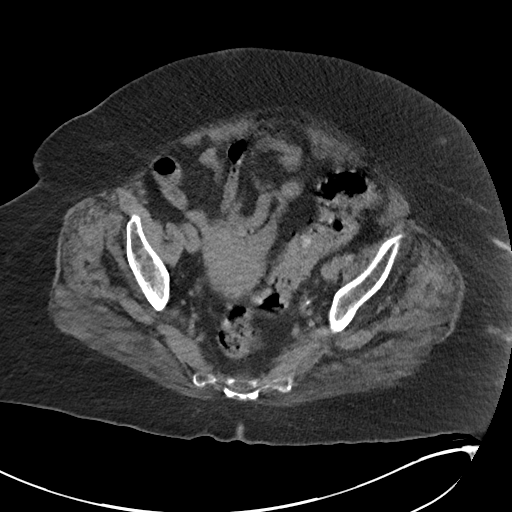
[im 26/75  soft-tissue]
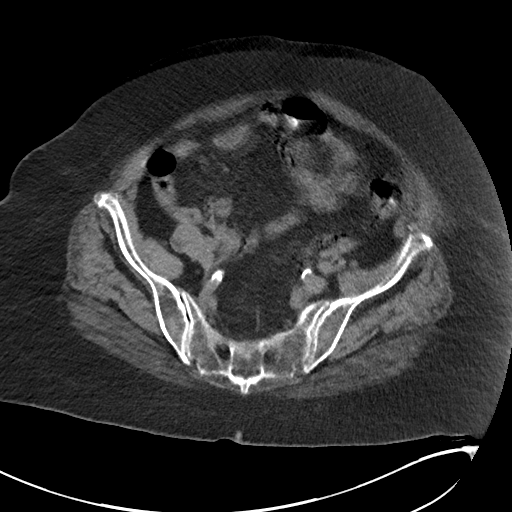
[im 31/75  soft-tissue]
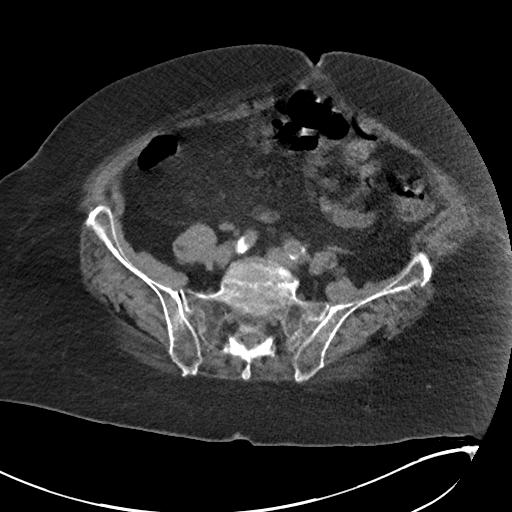
[im 36/75  soft-tissue]
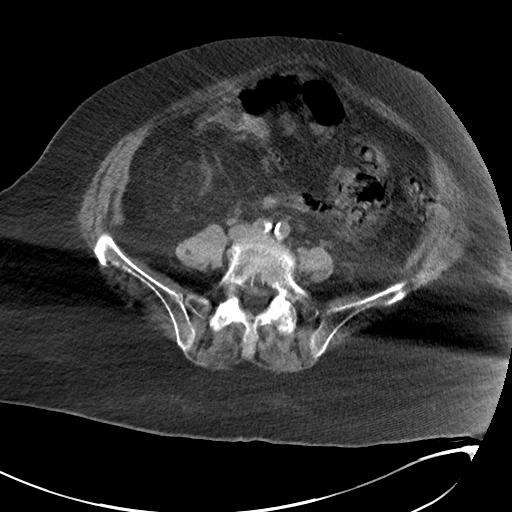
[im 41/75  soft-tissue]
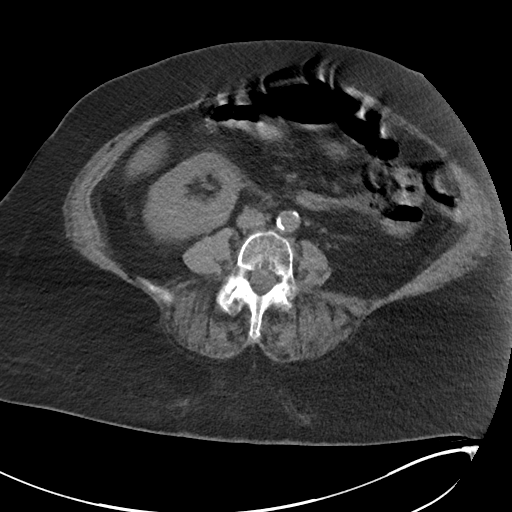
[im 46/75  soft-tissue]
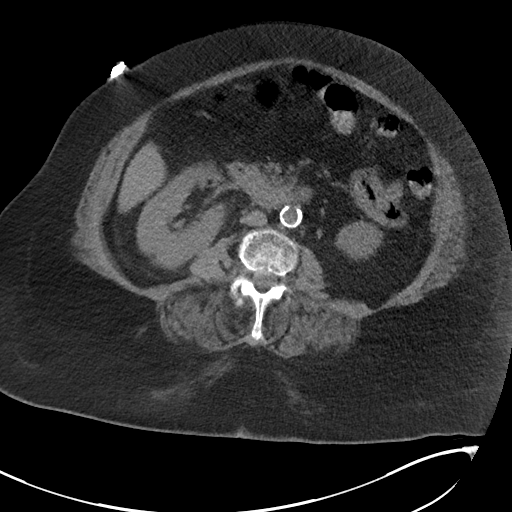
[im 46/75  bone]
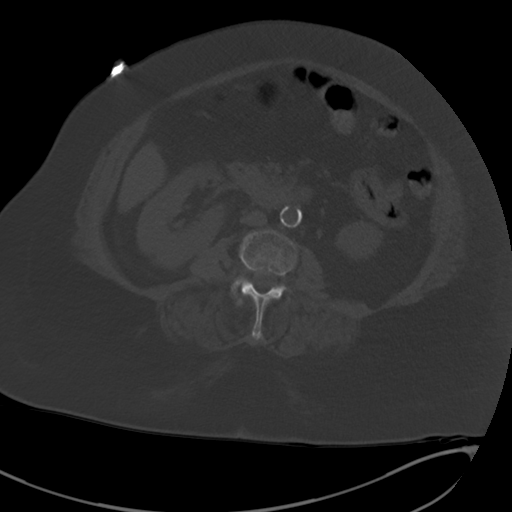
[im 52/75  soft-tissue]
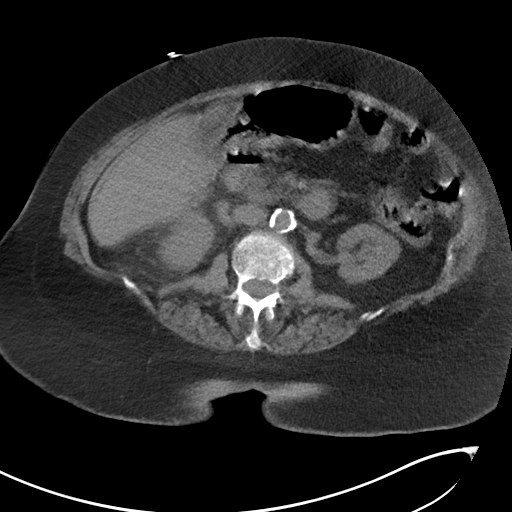
[im 57/75  soft-tissue]
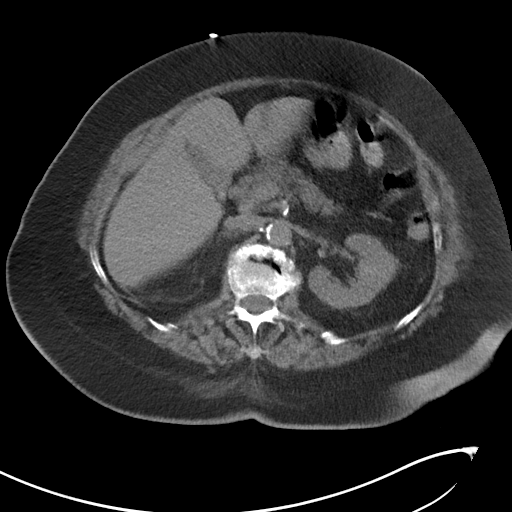
[im 62/75  soft-tissue]
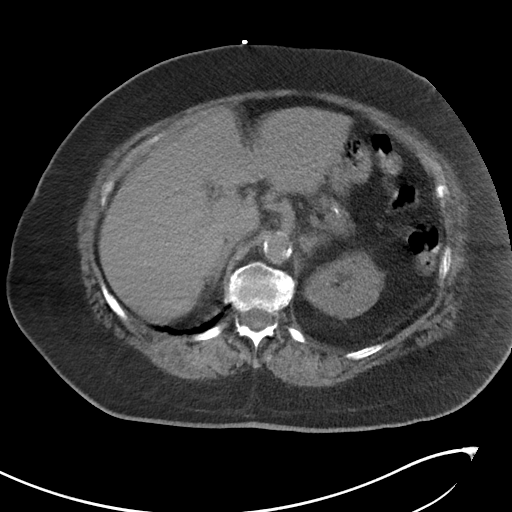
[im 67/75  soft-tissue]
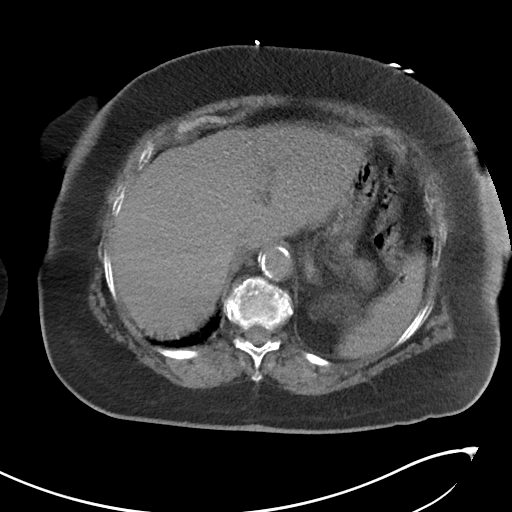
[im 72/75  soft-tissue]
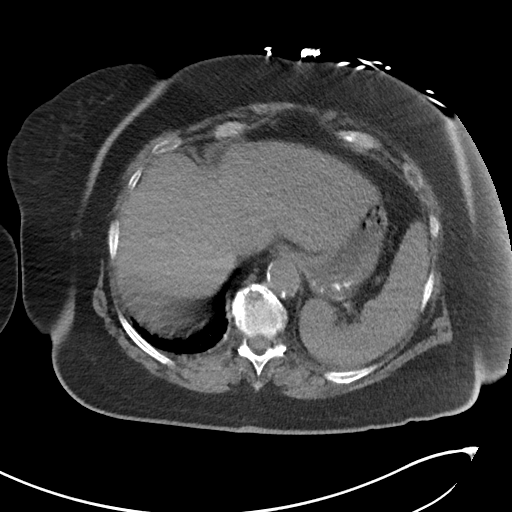

[Series 5: coronal soft tissue · coronal · 0.79mm/px · 3 of 105 slices shown]
[im 35/105  soft-tissue]
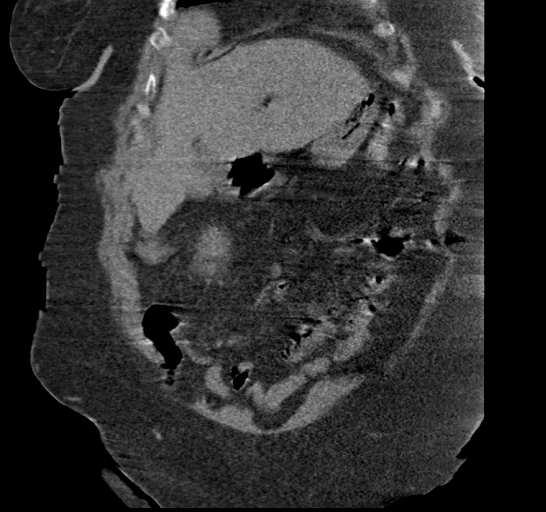
[im 47/105  soft-tissue]
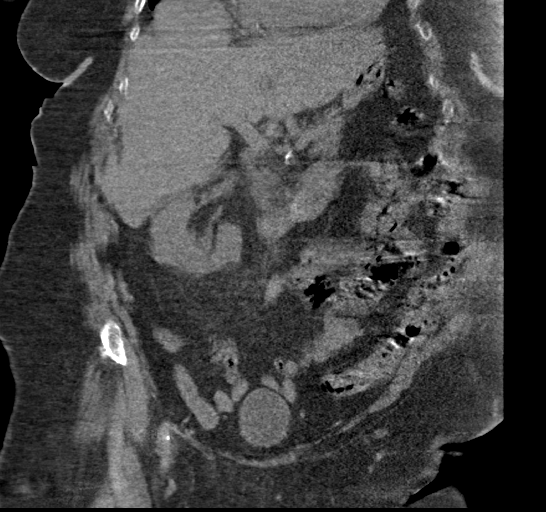
[im 58/105  soft-tissue]
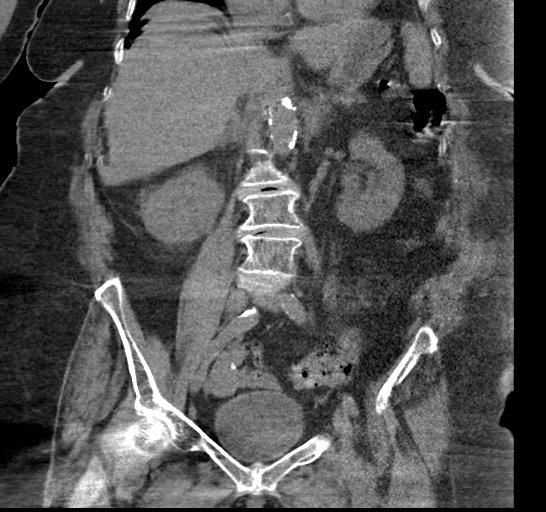

[17 of 46 positions shown; findings below may reference images not displayed]

FINDINGS: Lower chest: There is elevation of the left hemidiaphragm. The
visualized right lung base is unremarkable

Hepatobiliary: The visualized liver is unremarkable. Cholelithiasis
noted. No pericholecystic inflammatory change. No intra or
extrahepatic biliary ductal dilation.

Pancreas: Unremarkable

Spleen: The visualized spleen is unremarkable.

Adrenals/Urinary Tract: Adrenal glands are unremarkable. Kidneys are
normal, without renal calculi, focal lesion, or hydronephrosis.
Bladder is unremarkable.

Stomach/Bowel: Evaluation of the bowel is limited by motion
artifact. The stomach, small bowel, and large bowel, however,

Vascular/Lymphatic: Extensive aortoiliac atherosclerotic
calcification. No aortic aneurysm. Circumaortic left renal vein. No
pathologic adenopathy within the abdomen and pelvis.

Reproductive: Uterus and bilateral adnexa are unremarkable.

Other: No abdominal wall hernia.  Rectum unremarkable.

Musculoskeletal: Bilateral hip ORIF has been performed with dynamic
hip screws noted. Degenerative changes are seen within the lumbar
spine. No acute bone abnormality.
IMPRESSION: Limited examination without evidence of acute intra-abdominal
pathology. No definite radiographic explanation for the patient's
reported symptoms.

Cholelithiasis.

Distal colonic diverticulosis

Aortic Atherosclerosis ([id number]).
